# Patient Record
Sex: Female | Born: 1980 | Race: Black or African American | Hispanic: No | Smoking: Never smoker
Health system: Southern US, Community
[De-identification: ages and names within clinical notes are randomized; demographics above are authoritative.]

## PROBLEM LIST (undated history)

## (undated) ENCOUNTER — Inpatient Hospital Stay (HOSPITAL_COMMUNITY): Payer: Self-pay

## (undated) DIAGNOSIS — L309 Dermatitis, unspecified: Secondary | ICD-10-CM

## (undated) DIAGNOSIS — I10 Essential (primary) hypertension: Secondary | ICD-10-CM

## (undated) DIAGNOSIS — Z9109 Other allergy status, other than to drugs and biological substances: Secondary | ICD-10-CM

## (undated) DIAGNOSIS — O1495 Unspecified pre-eclampsia, complicating the puerperium: Secondary | ICD-10-CM

## (undated) DIAGNOSIS — Z789 Other specified health status: Secondary | ICD-10-CM

## (undated) HISTORY — DX: Other allergy status, other than to drugs and biological substances: Z91.09

## (undated) HISTORY — DX: Essential (primary) hypertension: I10

## (undated) HISTORY — DX: Dermatitis, unspecified: L30.9

## (undated) HISTORY — DX: Unspecified pre-eclampsia, complicating the puerperium: O14.95

## (undated) HISTORY — PX: NO PAST SURGERIES: SHX2092

---

## 2001-01-25 ENCOUNTER — Other Ambulatory Visit: Admission: RE | Admit: 2001-01-25 | Discharge: 2001-01-25 | Payer: Self-pay | Admitting: Family Medicine

## 2002-05-23 ENCOUNTER — Other Ambulatory Visit: Admission: RE | Admit: 2002-05-23 | Discharge: 2002-05-23 | Payer: Self-pay | Admitting: Family Medicine

## 2005-01-24 ENCOUNTER — Emergency Department (HOSPITAL_COMMUNITY): Admission: EM | Admit: 2005-01-24 | Discharge: 2005-01-24 | Payer: Self-pay | Admitting: Emergency Medicine

## 2005-06-22 ENCOUNTER — Inpatient Hospital Stay (HOSPITAL_COMMUNITY): Admission: AD | Admit: 2005-06-22 | Discharge: 2005-06-22 | Payer: Self-pay | Admitting: *Deleted

## 2005-06-27 ENCOUNTER — Ambulatory Visit: Payer: Self-pay | Admitting: Obstetrics and Gynecology

## 2005-10-06 ENCOUNTER — Emergency Department (HOSPITAL_COMMUNITY): Admission: EM | Admit: 2005-10-06 | Discharge: 2005-10-06 | Payer: Self-pay | Admitting: Emergency Medicine

## 2006-04-22 ENCOUNTER — Emergency Department (HOSPITAL_COMMUNITY): Admission: EM | Admit: 2006-04-22 | Discharge: 2006-04-22 | Payer: Self-pay | Admitting: Emergency Medicine

## 2007-10-29 ENCOUNTER — Encounter: Payer: Self-pay | Admitting: Family Medicine

## 2007-10-29 ENCOUNTER — Other Ambulatory Visit: Admission: RE | Admit: 2007-10-29 | Discharge: 2007-10-29 | Payer: Self-pay | Admitting: Family Medicine

## 2007-10-29 ENCOUNTER — Ambulatory Visit: Payer: Self-pay | Admitting: Family Medicine

## 2007-11-22 ENCOUNTER — Telehealth: Payer: Self-pay | Admitting: *Deleted

## 2007-12-23 ENCOUNTER — Ambulatory Visit: Payer: Self-pay | Admitting: Family Medicine

## 2008-04-14 DIAGNOSIS — M545 Low back pain: Secondary | ICD-10-CM

## 2008-04-29 ENCOUNTER — Ambulatory Visit: Payer: Self-pay | Admitting: Family Medicine

## 2008-04-29 DIAGNOSIS — L738 Other specified follicular disorders: Secondary | ICD-10-CM | POA: Insufficient documentation

## 2008-05-04 ENCOUNTER — Telehealth: Payer: Self-pay | Admitting: *Deleted

## 2008-07-31 ENCOUNTER — Ambulatory Visit: Payer: Self-pay | Admitting: Family Medicine

## 2008-09-14 ENCOUNTER — Ambulatory Visit: Payer: Self-pay | Admitting: Family Medicine

## 2008-09-14 DIAGNOSIS — N949 Unspecified condition associated with female genital organs and menstrual cycle: Secondary | ICD-10-CM | POA: Insufficient documentation

## 2008-09-16 ENCOUNTER — Telehealth: Payer: Self-pay | Admitting: *Deleted

## 2009-05-02 ENCOUNTER — Inpatient Hospital Stay (HOSPITAL_COMMUNITY): Admission: AD | Admit: 2009-05-02 | Discharge: 2009-05-04 | Payer: Self-pay | Admitting: Obstetrics and Gynecology

## 2010-08-15 LAB — CBC
HCT: 25.3 % — ABNORMAL LOW (ref 36.0–46.0)
MCHC: 33.1 g/dL (ref 30.0–36.0)
MCV: 87.8 fL (ref 78.0–100.0)
Platelets: 138 10*3/uL — ABNORMAL LOW (ref 150–400)
Platelets: 144 10*3/uL — ABNORMAL LOW (ref 150–400)
RBC: 2.88 MIL/uL — ABNORMAL LOW (ref 3.87–5.11)
RBC: 3.23 MIL/uL — ABNORMAL LOW (ref 3.87–5.11)
RDW: 14.3 % (ref 11.5–15.5)
WBC: 10.8 10*3/uL — ABNORMAL HIGH (ref 4.0–10.5)
WBC: 6.8 10*3/uL (ref 4.0–10.5)

## 2010-08-15 LAB — RPR: RPR Ser Ql: NONREACTIVE

## 2010-09-30 NOTE — Group Therapy Note (Signed)
NAME:  Jody Bullock, Jody Bullock NO.:  [id number]   MEDICAL RECORD NO.:  [id number]          PATIENT TYPE:  WOC   LOCATION:  [facility]                   FACILITY:  [facility]   PHYSICIAN:  Argentina Donovan, MD        DATE OF BIRTH:  [birth year]   DATE OF SERVICE:                                    CLINIC NOTE   The patient is a 30 year old nulligravida black female, who went in to the  maternity admissions office 5 days ago with a recurrent left Bartholin gland  abscess.  At that time apparently was not ripe enough to drain and the  patient was put on doxycycline and sitz baths.  During this time since then,  it had drained spontaneously and she comes in today and the area is well  healed and there is no sign of inflammation or pain.  We discussed with her  the problem and we went over the physiology of Bartholin gland cysts,  abscess and the functioning of the gland, and told her if it recurs on that  side again, the best thing to do would be to drain it, but she should  probably hopefully come in before it forms abscess.  The patient seems to  understand quite well what we have discussed and is being discharged.   DIAGNOSIS:  Bartholin gland abscess, resolved.           ______________________________  Argentina Donovan, MD     PR/MEDQ  D:  06/27/2005  T:  06/27/2005  Job:  [id number]

## 2010-11-24 ENCOUNTER — Encounter: Payer: Self-pay | Admitting: Family Medicine

## 2010-11-29 ENCOUNTER — Ambulatory Visit (INDEPENDENT_AMBULATORY_CARE_PROVIDER_SITE_OTHER): Payer: BC Managed Care – PPO | Admitting: Family Medicine

## 2010-11-29 ENCOUNTER — Encounter: Payer: Self-pay | Admitting: Family Medicine

## 2010-11-29 ENCOUNTER — Other Ambulatory Visit (HOSPITAL_COMMUNITY)
Admission: RE | Admit: 2010-11-29 | Discharge: 2010-11-29 | Disposition: A | Discharge status: Home / Self Care | Payer: BC Managed Care – PPO | Source: Ambulatory Visit | Attending: Family Medicine | Admitting: Family Medicine

## 2010-11-29 DIAGNOSIS — R5381 Other malaise: Secondary | ICD-10-CM

## 2010-11-29 DIAGNOSIS — Z01419 Encounter for gynecological examination (general) (routine) without abnormal findings: Secondary | ICD-10-CM

## 2010-11-29 DIAGNOSIS — L738 Other specified follicular disorders: Secondary | ICD-10-CM

## 2010-11-29 DIAGNOSIS — R5383 Other fatigue: Secondary | ICD-10-CM

## 2010-11-29 LAB — CBC WITH DIFFERENTIAL/PLATELET
Basophils Absolute: 0 10*3/uL (ref 0.0–0.1)
HCT: 36.6 % (ref 36.0–46.0)
Lymphs Abs: 2 10*3/uL (ref 0.7–4.0)
Monocytes Relative: 6.9 % (ref 3.0–12.0)
Neutrophils Relative %: 51.7 % (ref 43.0–77.0)
Platelets: 175 10*3/uL (ref 150.0–400.0)
RDW: 14.7 % — ABNORMAL HIGH (ref 11.5–14.6)

## 2010-11-29 LAB — POCT URINALYSIS DIPSTICK
Bilirubin, UA: NEGATIVE
Glucose, UA: NEGATIVE
Spec Grav, UA: 1.03

## 2010-11-29 LAB — HEPATIC FUNCTION PANEL
Alkaline Phosphatase: 51 U/L (ref 39–117)
Bilirubin, Direct: 0 mg/dL (ref 0.0–0.3)

## 2010-11-29 LAB — BASIC METABOLIC PANEL
CO2: 26 mEq/L (ref 19–32)
Calcium: 9 mg/dL (ref 8.4–10.5)
GFR: 115.93 mL/min (ref >60.00)
Sodium: 140 mEq/L (ref 135–145)

## 2010-11-29 NOTE — Patient Instructions (Signed)
Be sure to do a thorough breast exam monthly.  Return in one year, sooner if any problems.  We will cardia.  The report of your lab work.  Motrin 600 mg twice daily with food for knee pain

## 2010-11-29 NOTE — Progress Notes (Signed)
  Subjective:    Patient ID: Jody Bullock, female    DOB: 11-Sep-1980, 30 y.o.   MRN: [id number]  HPI Jody Bullock is a delightful 30 year old single female, nonsmoker G1, P1,,,,,,,,,, a 33-month-old son,,,,,,, who comes in today for a general physical examination because of fatigue.  Postpartum, Dr. Edward Jolly, put in a [place] IUD.  She is still having regular periods despite having an IUD in the she's noticed decreased sex drive.  Otherwise feels well.  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  Tetanus 2009   Review of Systems  Constitutional: Negative.   HENT: Negative.   Eyes: Negative.   Respiratory: Negative.   Cardiovascular: Negative.   Gastrointestinal: Negative.   Genitourinary: Negative.   Musculoskeletal: Negative.   Neurological: Negative.   Hematological: Negative.   Psychiatric/Behavioral: Negative.        Objective:   Physical Exam  Constitutional: She appears well-developed and well-nourished.  HENT:  Head: Normocephalic and atraumatic.  Right Ear: External ear normal.  Left Ear: External ear normal.  Nose: Nose normal.  Mouth/Throat: Oropharynx is clear and moist.  Eyes: EOM are normal. Pupils are equal, round, and reactive to light.  Neck: Normal range of motion. Neck supple. No thyromegaly present.  Cardiovascular: Normal rate, regular rhythm, normal heart sounds and intact distal pulses.  Exam reveals no gallop and no friction rub.   No murmur heard. Pulmonary/Chest: Effort normal and breath sounds normal.  Abdominal: Soft. Bowel sounds are normal. She exhibits no distension and no mass. There is no tenderness. There is no rebound.  Genitourinary: Vagina normal and uterus normal. Guaiac negative stool. No vaginal discharge found.       Bilateral breast exam normal  Musculoskeletal: Normal range of motion.  Lymphadenopathy:    She has no cervical adenopathy.  Neurological: She is alert. She has normal reflexes. No cranial  nerve deficit. She exhibits normal muscle tone. Coordination normal.  Skin: Skin is warm and dry.  Psychiatric: She has a normal mood and affect. Her behavior is normal. Judgment and thought content normal.          Assessment & Plan:  Healthy female.  Fatigue.  Status post childbirth x 1

## 2011-10-04 ENCOUNTER — Ambulatory Visit (INDEPENDENT_AMBULATORY_CARE_PROVIDER_SITE_OTHER): Payer: BC Managed Care – PPO | Admitting: Family Medicine

## 2011-10-04 ENCOUNTER — Encounter: Payer: Self-pay | Admitting: Family Medicine

## 2011-10-04 ENCOUNTER — Telehealth: Payer: Self-pay

## 2011-10-04 ENCOUNTER — Other Ambulatory Visit (HOSPITAL_COMMUNITY)
Admission: RE | Admit: 2011-10-04 | Discharge: 2011-10-04 | Disposition: A | Discharge status: Home / Self Care | Payer: BC Managed Care – PPO | Source: Ambulatory Visit | Attending: Family Medicine | Admitting: Family Medicine

## 2011-10-04 VITALS — BP 110/78 | Temp 98.1°F | Ht 61.0 in | Wt 159.0 lb

## 2011-10-04 DIAGNOSIS — Z0000 Encounter for general adult medical examination without abnormal findings: Secondary | ICD-10-CM | POA: Insufficient documentation

## 2011-10-04 DIAGNOSIS — Z01419 Encounter for gynecological examination (general) (routine) without abnormal findings: Secondary | ICD-10-CM

## 2011-10-04 DIAGNOSIS — R5383 Other fatigue: Secondary | ICD-10-CM

## 2011-10-04 DIAGNOSIS — R5381 Other malaise: Secondary | ICD-10-CM

## 2011-10-04 LAB — CBC WITH DIFFERENTIAL/PLATELET
Basophils Relative: 0.6 % (ref 0.0–3.0)
Eosinophils Relative: 1.4 % (ref 0.0–5.0)
HCT: 34.9 % — ABNORMAL LOW (ref 36.0–46.0)
Lymphs Abs: 2 10*3/uL (ref 0.7–4.0)
MCV: 90.6 fl (ref 78.0–100.0)
Monocytes Absolute: 0.4 10*3/uL (ref 0.1–1.0)
RBC: 3.85 Mil/uL — ABNORMAL LOW (ref 3.87–5.11)
WBC: 4.9 10*3/uL (ref 4.5–10.5)

## 2011-10-04 LAB — BASIC METABOLIC PANEL
CO2: 28 mEq/L (ref 19–32)
Calcium: 9 mg/dL (ref 8.4–10.5)
Creatinine, Ser: 0.7 mg/dL (ref 0.4–1.2)
Glucose, Bld: 49 mg/dL — CL (ref 70–99)
Sodium: 141 mEq/L (ref 135–145)

## 2011-10-04 NOTE — Patient Instructions (Signed)
At home,,,,,,,,,,,,,,, check  the string on the IUD monthly  If there is a question you can not find it,,,,,,,,,,,,, come in and let us recheck u  Will call you to more about her lab work  Remember to do a thorough breast exam monthly

## 2011-10-04 NOTE — Telephone Encounter (Signed)
Spoke with pt- A and O x3 - at work , informed we got a very low blood sugar on her that was drawn this AM - 49 - she states she ate breakfast early before lab had been drawn , but has not eaten the rest of the day - she has candy - I asked her to eat now and get something to drink . I also instructed to eat good dinner , and a snack around 10pm . Call rachel around 845 to check in and see what dr. Tawanna Cooler would like to do. Verbalized understanding .  Note to Dr. Artist Pais to advise further instructions

## 2011-10-04 NOTE — Progress Notes (Signed)
  Subjective:    Patient ID: Jody Bullock, female    DOB: [birth year], 31 y.o.   MRN: [id number]  HPI Jody Bullock is a delightful 31 year old single female G1 P1.......... 30-year-old son.......... she lives with the child's father although they've not gotten married yet,,,,,,,, who comes in today for general physical examination  She's always been in excellent health she's had no chronic health problems. After the delivery of her child she had inserted a Mirena IUD she still has monthly periods despite having the IUD. She says she feels a tired and fatigued wonders if she might not be anemic  Tetanus booster 2009   Review of Systems  Constitutional: Negative.   HENT: Negative.   Eyes: Negative.   Respiratory: Negative.   Cardiovascular: Negative.   Gastrointestinal: Negative.   Genitourinary: Negative.   Musculoskeletal: Negative.   Neurological: Negative.   Hematological: Negative.   Psychiatric/Behavioral: Negative.        Objective:   Physical Exam  Constitutional: She appears well-developed and well-nourished.  HENT:  Head: Normocephalic and atraumatic.  Right Ear: External ear normal.  Left Ear: External ear normal.  Nose: Nose normal.  Mouth/Throat: Oropharynx is clear and moist.  Eyes: EOM are normal. Pupils are equal, round, and reactive to light.  Neck: Normal range of motion. Neck supple. No thyromegaly present.  Cardiovascular: Normal rate, regular rhythm, normal heart sounds and intact distal pulses.  Exam reveals no gallop and no friction rub.   No murmur heard. Pulmonary/Chest: Effort normal and breath sounds normal.  Abdominal: Soft. Bowel sounds are normal. She exhibits no distension and no mass. There is no tenderness. There is no rebound.  Genitourinary: Vagina normal and uterus normal. Guaiac negative stool. No vaginal discharge found.  Musculoskeletal: Normal range of motion.  Lymphadenopathy:    She has no cervical adenopathy.  Neurological: She is  alert. She has normal reflexes. No cranial nerve deficit. She exhibits normal muscle tone. Coordination normal.  Skin: Skin is warm and dry.  Psychiatric: She has a normal mood and affect. Her behavior is normal. Judgment and thought content normal.          Assessment & Plan:  Healthy female  Fatigue check CBC and thyroid level

## 2011-10-04 NOTE — Telephone Encounter (Signed)
Lab shown to Dr. Artist Pais - pt is asymtomatic - call in AM to f/u with .  To Fleet Contras to call

## 2011-11-23 ENCOUNTER — Encounter: Payer: Self-pay | Admitting: Family Medicine

## 2011-11-23 ENCOUNTER — Ambulatory Visit (INDEPENDENT_AMBULATORY_CARE_PROVIDER_SITE_OTHER): Payer: BC Managed Care – PPO | Admitting: Family Medicine

## 2011-11-23 VITALS — BP 110/70 | Temp 98.3°F | Wt 169.0 lb

## 2011-11-23 DIAGNOSIS — N6019 Diffuse cystic mastopathy of unspecified breast: Secondary | ICD-10-CM

## 2011-11-23 DIAGNOSIS — N938 Other specified abnormal uterine and vaginal bleeding: Secondary | ICD-10-CM

## 2011-11-23 DIAGNOSIS — N949 Unspecified condition associated with female genital organs and menstrual cycle: Secondary | ICD-10-CM

## 2011-11-23 MED ORDER — ETHYNODIOL DIAC-ETH ESTRADIOL 1-50 MG-MCG PO TABS: 1.0000 | ORAL_TABLET | Freq: Every day | ORAL | Status: DC

## 2011-11-23 NOTE — Patient Instructions (Addendum)
Do a thorough breast exam monthly after each.  Start the [facility] this coming Sunday  Return for your annual exam in May

## 2011-11-23 NOTE — Progress Notes (Signed)
  Subjective:    Patient ID: Jody Bullock, female    DOB: Jun 12, 1980, 31 y.o.   MRN: [id number]  HPI Lyndee is a 31 year old single female G1 P1,,,,,,,,,, 41-year-old son,,,,,,, who comes in today because she would like her IUD removed  The IUD was put in after her first baby and she would like it removed. She wishes to go back on oral contraceptives  She also complains of soreness in her breasts prior to her period   Review of Systems Gen. review of systems otherwise negative    Objective:   Physical Exam Well-developed and nourished female in acute distress examination of pressure some cystic lesions at 6:00 right and left breasts are soft small movable and tender  Pelvic examination the string was visualized the IUD was removed.       Assessment & Plan:  Fibrocystic breast changes plan BSE monthly Motrin when necessary  Restart BCPs

## 2011-11-29 ENCOUNTER — Telehealth: Payer: Self-pay | Admitting: Family Medicine

## 2011-11-29 ENCOUNTER — Ambulatory Visit: Payer: BC Managed Care – PPO | Admitting: Family Medicine

## 2011-11-29 NOTE — Telephone Encounter (Signed)
Patient called stating that she was given new birth control pills and they have her really sick and she would like to know if there is another one she can take. Please advise.

## 2011-11-30 ENCOUNTER — Ambulatory Visit (INDEPENDENT_AMBULATORY_CARE_PROVIDER_SITE_OTHER): Payer: BC Managed Care – PPO | Admitting: Family Medicine

## 2011-11-30 ENCOUNTER — Encounter: Payer: Self-pay | Admitting: Family Medicine

## 2011-11-30 VITALS — BP 110/70 | Temp 99.0°F | Wt 160.0 lb

## 2011-11-30 DIAGNOSIS — N925 Other specified irregular menstruation: Secondary | ICD-10-CM

## 2011-11-30 DIAGNOSIS — N949 Unspecified condition associated with female genital organs and menstrual cycle: Secondary | ICD-10-CM

## 2011-11-30 MED ORDER — ETHYNODIOL DIAC-ETH ESTRADIOL 1-35 MG-MCG PO TABS: 1.0000 | ORAL_TABLET | Freq: Every day | ORAL | Status: DC

## 2011-11-30 NOTE — Progress Notes (Signed)
  Subjective:    Patient ID: Jody Bullock, female    DOB: 1980-10-08, 31 y.o.   MRN: [id number]  HPI Jody Bullock is a 31 year old female who comes in today for evaluation of birth control  We removed her IUD a week ago and started her on Ortho-Novum 1/50 however she states it made her nauseated. Review of systems otherwise negative   Review of Systems    review of systems negative Objective:   Physical Exam  Well-developed and nourished female no acute distress cardiopulmonary exam normal      Assessment & Plan:  Side effects from BCPs plan decrease dose to 135

## 2011-11-30 NOTE — Patient Instructions (Addendum)
Let's decrease the dose and take the pill with some food prior to bedtime

## 2011-12-02 ENCOUNTER — Encounter (HOSPITAL_COMMUNITY): Payer: Self-pay | Admitting: *Deleted

## 2011-12-02 ENCOUNTER — Emergency Department (HOSPITAL_COMMUNITY)
Admission: EM | Admit: 2011-12-02 | Discharge: 2011-12-02 | Disposition: A | Discharge status: Home / Self Care | Payer: BC Managed Care – PPO | Attending: Emergency Medicine | Admitting: Emergency Medicine

## 2011-12-02 ENCOUNTER — Emergency Department (HOSPITAL_COMMUNITY): Payer: BC Managed Care – PPO

## 2011-12-02 DIAGNOSIS — K219 Gastro-esophageal reflux disease without esophagitis: Secondary | ICD-10-CM | POA: Insufficient documentation

## 2011-12-02 LAB — POCT I-STAT, CHEM 8
BUN: 8 mg/dL (ref 6–23)
Calcium, Ion: 1.24 mmol/L — ABNORMAL HIGH (ref 1.12–1.23)
Creatinine, Ser: 0.9 mg/dL (ref 0.50–1.10)
Glucose, Bld: 86 mg/dL (ref 70–99)
TCO2: 24 mmol/L (ref 0–100)

## 2011-12-02 MED ORDER — OXYCODONE-ACETAMINOPHEN 5-325 MG PO TABS
2.0000 | ORAL_TABLET | Freq: Once | ORAL | Status: AC
  Administered 2011-12-02: 2 via ORAL
  Filled 2011-12-02: qty 2

## 2011-12-02 MED ORDER — FAMOTIDINE 20 MG PO TABS: 20.0000 mg | ORAL_TABLET | Freq: Two times a day (BID) | ORAL | Status: DC

## 2011-12-02 MED ORDER — HYDROCODONE-ACETAMINOPHEN 5-500 MG PO TABS: 1.0000 | ORAL_TABLET | Freq: Four times a day (QID) | ORAL | Status: AC | PRN

## 2011-12-02 NOTE — ED Provider Notes (Signed)
History     CSN: [id number]  Arrival date & time 12/02/11  0944   First MD Initiated Contact with Patient 12/02/11 1138      Chief Complaint  Patient presents with  . Chest Pain    (Consider location/radiation/quality/duration/timing/severity/associated sxs/prior treatment) HPI   Jody Bullock presents to the ER brought in by her significant other with complaints of chest discomfort since last Sunday. He states that it started in her right shoulder and now is in her throat and substernal region. It gets worse after eating. She states that she has been very gassy and burping he as well. She admits that her dad has a PMH of GERD. Denies pain worsening with palpation 4 with large inspiration. Denies use of tobacco, smoking, family history of cardiac disease. She started birth control one week ago. She saw her primary care doctor for this problem and he told her nothing was wrong. She states that her pain is mild and intermittent. And does not worsen or resolve with activity. Not had any symptoms today. If, nausea, vomiting, diarrhea.   History reviewed. No pertinent past medical history.  History reviewed. No pertinent past surgical history.  Family History  Problem Relation Age of Onset  . Gallbladder disease Mother   . Cancer Father     prostate     History  Substance Use Topics  . Smoking status: Never Smoker   . Smokeless tobacco: Not on file  . Alcohol Use: No    OB History    Grav Para Term Preterm Abortions TAB SAB Ect Mult Living                  Review of Systems   HEENT: denies blurry vision or change in hearing PULMONARY: Denies difficulty breathing and SOB CARDIAC: denies chest pain or heart palpitations at this time. MUSCULOSKELETAL:  denies being unable to ambulate ABDOMEN AL: denies abdominal pain GU: denies loss of bowel or urinary control NEURO: denies numbness and tingling in extremities SKIN: no new rashes PSYCH: patient denies anxiety or depression. NECK:  Pt denies having neck pain     Allergies  Review of patient's allergies indicates no known allergies.  Home Medications   Current Outpatient Rx  Name Route Sig Dispense Refill  . EXCEDRIN PO Oral Take 2 tablets by mouth once.    . ETHYNODIOL DIAC-ETH ESTRADIOL 1-50 MG-MCG PO TABS Oral Take 1 tablet by mouth daily.    Marland Kitchen FAMOTIDINE 20 MG PO TABS Oral Take 1 tablet (20 mg total) by mouth 2 (two) times daily. 30 tablet 0  . HYDROCODONE-ACETAMINOPHEN 5-500 MG PO TABS Oral Take 1 tablet by mouth every 6 (six) hours as needed for pain. 15 tablet 0    BP 155/90  Pulse 78  Temp 98.3 F (36.8 C) (Oral)  Resp 20  SpO2 100%  LMP 11/20/2011  Physical Exam  Nursing note and vitals reviewed. Constitutional: She appears well-developed and well-nourished. No distress.  HENT:  Head: Normocephalic and atraumatic.  Eyes: Pupils are equal, round, and reactive to light.  Neck: Normal range of motion. Neck supple.  Cardiovascular: Normal rate, regular rhythm and normal heart sounds.   No murmur heard. Pulmonary/Chest: Effort normal. No respiratory distress. She has no wheezes. She has no rales. She exhibits no tenderness.  Abdominal: Soft. There is no tenderness. There is no rebound and no guarding.  Neurological: She is alert.  Skin: Skin is warm and dry.    ED Course  Procedures (including critical  care time)  Labs Reviewed  POCT I-STAT, CHEM 8 - Abnormal; Notable for the following:    Calcium, Ion 1.24 (*)     All other components within normal limits  D-DIMER, QUANTITATIVE   Dg Chest 2 View  12/02/2011  *RADIOLOGY REPORT*  Clinical Data: Chest and back pain  CHEST - 2 VIEW  Comparison:  None.  Findings:  The heart size and mediastinal contours are within normal limits.  Both lungs are clear.  The visualized skeletal structures are unremarkable.  IMPRESSION: No active cardiopulmonary disease.  Original Report Authenticated By: Judie Petit. Ruel Favors, M.D.     1. GERD (gastroesophageal  reflux disease)       MDM   Date: 12/02/2011  Rate: 56  Rhythm: sinus bradycardia  QRS Axis: normal  Intervals: normal  ST/T Wave abnormalities: normal  Conduction Disutrbances:none  Narrative Interpretation:   Old EKG Reviewed: none available   Him sounds like reflux disease. Chest x-ray, EKG, d-dimer all within normal limits. Asked with patient the findings and plan. I've advised her to followup with Dr. Tawanna Cooler on Monday and Tuesday to see how anti-acid medication is helping with her symptoms. During exam the patient burps many times and complains that her throat hurts after the burp and then chest burns.  Will do trial of pepcid for two weeks  Pt has been advised of the symptoms that warrant their return to the ED. Patient has voiced understanding and has agreed to follow-up with the PCP or specialist.       Dorthula Matas, PA 12/02/11 1349  Dorthula Matas, PA 12/02/11 1350

## 2011-12-02 NOTE — ED Notes (Signed)
Reports starting new BC last Sunday, started with headache, pain moved to right arm and now into her chest. No acute distress noted at triage, ekg done.

## 2011-12-04 NOTE — ED Provider Notes (Signed)
Medical screening examination/treatment/procedure(s) were conducted as a shared visit with non-physician practitioner(s) and myself.  I personally evaluated the patient during the encounter Pt c/o atypical cp, midline. Chest cta. Rrr. Labs.   Suzi Roots, MD 12/04/11 [phone]

## 2012-07-29 ENCOUNTER — Telehealth: Payer: Self-pay | Admitting: Family Medicine

## 2012-07-29 ENCOUNTER — Ambulatory Visit: Payer: BC Managed Care – PPO | Admitting: Family Medicine

## 2012-07-29 NOTE — Telephone Encounter (Signed)
Pt would like to go back to Mirena birth control method. Pt cannot take the [facility] pill. Pt doesn't want to wait too long to put Mirena back in, as she needs BC control!! Pt would like to know if it is ok to schedule this with another provider here, if so who? Pls advise.

## 2012-07-29 NOTE — Telephone Encounter (Signed)
We do not place Mirena in the office.  She will have to go to GYN.  If she would like another form of BCP then okay to see Ms Orvan Falconer or Dr Selena Batten

## 2012-07-30 NOTE — Telephone Encounter (Signed)
lmom for pt to return my call/kh °

## 2012-07-30 NOTE — Telephone Encounter (Signed)
Pt will call a GYN/kh

## 2012-09-03 DIAGNOSIS — O039 Complete or unspecified spontaneous abortion without complication: Secondary | ICD-10-CM | POA: Insufficient documentation

## 2012-09-03 HISTORY — DX: Complete or unspecified spontaneous abortion without complication: O03.9

## 2012-09-05 ENCOUNTER — Ambulatory Visit (INDEPENDENT_AMBULATORY_CARE_PROVIDER_SITE_OTHER): Payer: BC Managed Care – PPO | Admitting: Family Medicine

## 2012-09-05 ENCOUNTER — Inpatient Hospital Stay (HOSPITAL_COMMUNITY): Payer: BC Managed Care – PPO

## 2012-09-05 ENCOUNTER — Encounter (HOSPITAL_COMMUNITY): Payer: Self-pay | Admitting: *Deleted

## 2012-09-05 ENCOUNTER — Encounter: Payer: Self-pay | Admitting: Family Medicine

## 2012-09-05 ENCOUNTER — Inpatient Hospital Stay (HOSPITAL_COMMUNITY)
Admission: AD | Admit: 2012-09-05 | Discharge: 2012-09-05 | Disposition: A | Discharge status: Home / Self Care | Payer: BC Managed Care – PPO | Source: Ambulatory Visit | Attending: Obstetrics & Gynecology | Admitting: Obstetrics & Gynecology

## 2012-09-05 VITALS — BP 120/80 | Temp 98.3°F | Wt 170.0 lb

## 2012-09-05 DIAGNOSIS — O0990 Supervision of high risk pregnancy, unspecified, unspecified trimester: Secondary | ICD-10-CM

## 2012-09-05 DIAGNOSIS — N949 Unspecified condition associated with female genital organs and menstrual cycle: Secondary | ICD-10-CM

## 2012-09-05 DIAGNOSIS — O2390 Unspecified genitourinary tract infection in pregnancy, unspecified trimester: Secondary | ICD-10-CM | POA: Insufficient documentation

## 2012-09-05 DIAGNOSIS — O200 Threatened abortion: Secondary | ICD-10-CM | POA: Insufficient documentation

## 2012-09-05 DIAGNOSIS — B9689 Other specified bacterial agents as the cause of diseases classified elsewhere: Secondary | ICD-10-CM | POA: Insufficient documentation

## 2012-09-05 DIAGNOSIS — A499 Bacterial infection, unspecified: Secondary | ICD-10-CM

## 2012-09-05 DIAGNOSIS — N938 Other specified abnormal uterine and vaginal bleeding: Secondary | ICD-10-CM

## 2012-09-05 DIAGNOSIS — N760 Acute vaginitis: Secondary | ICD-10-CM

## 2012-09-05 HISTORY — DX: Supervision of high risk pregnancy, unspecified, unspecified trimester: O09.90

## 2012-09-05 HISTORY — DX: Other specified health status: Z78.9

## 2012-09-05 LAB — CBC
MCV: 88.4 fL (ref 78.0–100.0)
Platelets: 155 10*3/uL (ref 150–400)
RDW: 13.8 % (ref 11.5–15.5)
WBC: 5.2 10*3/uL (ref 4.0–10.5)

## 2012-09-05 LAB — ABO/RH: ABO/RH(D): O POS

## 2012-09-05 LAB — WET PREP, GENITAL: Yeast Wet Prep HPF POC: NONE SEEN

## 2012-09-05 MED ORDER — METRONIDAZOLE 500 MG PO TABS: 500.0000 mg | ORAL_TABLET | Freq: Two times a day (BID) | ORAL | Status: DC

## 2012-09-05 NOTE — Progress Notes (Signed)
  Subjective:    Patient ID: Jody Bullock, female    DOB: [birth year], 32 y.o.   MRN: [id number]  HPI Jody Bullock is a 32 year old single female who comes in today having taken 2 pregnancy tests at home which are positive  Her last normal period was March 10. This past Monday she began having cramping and bleeding. She has an appointment to see the folks at [facility] may the second   Review of Systems    review of systems otherwise negative Objective:   Physical Exam Examination the abdomen the abdomen is soft the bowel sounds are normal no tenderness       Assessment & Plan:  From her dates I suspect she's [redacted] weeks pregnant with an impending miscarriage plan refer to GYN

## 2012-09-05 NOTE — MAU Note (Signed)
Told was preg, started bleeding on Monday., passed a clot on Monday, continues to bleed. Feels like contractions.

## 2012-09-05 NOTE — Patient Instructions (Addendum)
Go directly to the [facility] emergency room it appears you're having a spontaneous miscarriage

## 2012-09-05 NOTE — MAU Provider Note (Signed)
History     CSN: [id number]  Arrival date and time: 09/05/12 1121   None     Chief Complaint  Patient presents with  . Threatened Miscarriage   HPI  Pt is here with report of vaginal bleeding and lower pelvic cramping that started on Monday.  Bleeding is described as less than a period with the passing of a clot the next day.  Bleeding increased since passing the clot.  +pregnancy test last week.  Patient's last menstrual period was 07/22/2012.   Past Medical History  Diagnosis Date  . Medical history non-contributory     Past Surgical History  Procedure Laterality Date  . No past surgeries      Family History  Problem Relation Age of Onset  . Gallbladder disease Mother   . Cancer Father     prostate     History  Substance Use Topics  . Smoking status: Never Smoker   . Smokeless tobacco: Not on file  . Alcohol Use: No    Allergies: No Known Allergies  Prescriptions prior to admission  Medication Sig Dispense Refill  . Aspirin-Acetaminophen-Caffeine (EXCEDRIN PO) Take 2 tablets by mouth daily as needed (headaches).         Review of Systems  Gastrointestinal: Positive for abdominal pain (cramping).  Genitourinary:       Vaginal bleeding  All other systems reviewed and are negative.   Physical Exam   Blood pressure 137/83, pulse 56, temperature 98.8 F (37.1 C), temperature source Oral, resp. rate 18, height 5\' 1"  (1.549 m), weight 76.658 kg (169 lb), last menstrual period 07/22/2012.  Physical Exam  Constitutional: She is oriented to person, place, and time. She appears well-developed and well-nourished.  HENT:  Head: Normocephalic.  Neck: Normal range of motion. Neck supple.  Cardiovascular: Normal rate, regular rhythm and normal heart sounds.   Respiratory: Effort normal and breath sounds normal. No respiratory distress.  GI: Soft. She exhibits no mass. There is no tenderness. There is no rebound and no guarding.  Genitourinary: There is bleeding  around the vagina.  Neurological: She is alert and oriented to person, place, and time.  Skin: Skin is warm and dry.    MAU Course  Procedures Results for orders placed during the hospital encounter of 09/05/12 (from the past 24 hour(s))  POCT PREGNANCY, URINE     Status: Abnormal   Collection Time    09/05/12 12:20 PM      Result Value Range   Preg Test, Ur POSITIVE (*) NEGATIVE  ABO/RH     Status: None   Collection Time    09/05/12 12:34 PM      Result Value Range   ABO/RH(D) O POS    HCG, QUANTITATIVE, PREGNANCY     Status: Abnormal   Collection Time    09/05/12 12:34 PM      Result Value Range   hCG, Beta Chain, Quant, S 223 (*) <5 mIU/mL  CBC     Status: Abnormal   Collection Time    09/05/12 12:35 PM      Result Value Range   WBC 5.2  4.0 - 10.5 K/uL   RBC 3.97  3.87 - 5.11 MIL/uL   Hemoglobin 11.9 (*) 12.0 - 15.0 g/dL   HCT 29.5 (*) 62.1 - 30.8 %   MCV 88.4  78.0 - 100.0 fL   MCH 30.0  26.0 - 34.0 pg   MCHC 33.9  30.0 - 36.0 g/dL   RDW 65.7  84.6 -  15.5 %   Platelets 155  150 - 400 K/uL  WET PREP, GENITAL     Status: Abnormal   Collection Time    09/05/12 12:43 PM      Result Value Range   Yeast Wet Prep HPF POC NONE SEEN  NONE SEEN   Trich, Wet Prep NONE SEEN  NONE SEEN   Clue Cells Wet Prep HPF POC FEW (*) NONE SEEN   WBC, Wet Prep HPF POC FEW (*) NONE SEEN     Assessment and Plan   Bleeding in Pregnancy Bacterial Vaginosis  Plan: DC to home RX Flagyl Return in 48 hours for repeat BHCG   [facility] 09/05/2012, 12:20 PM

## 2012-09-06 LAB — GC/CHLAMYDIA PROBE AMP: CT Probe RNA: NEGATIVE

## 2012-09-07 ENCOUNTER — Inpatient Hospital Stay (HOSPITAL_COMMUNITY)
Admission: AD | Admit: 2012-09-07 | Discharge: 2012-09-07 | Disposition: A | Discharge status: Home / Self Care | Payer: BC Managed Care – PPO | Source: Ambulatory Visit | Attending: Obstetrics & Gynecology | Admitting: Obstetrics & Gynecology

## 2012-09-07 DIAGNOSIS — O039 Complete or unspecified spontaneous abortion without complication: Secondary | ICD-10-CM | POA: Insufficient documentation

## 2012-09-07 LAB — HCG, QUANTITATIVE, PREGNANCY: hCG, Beta Chain, Quant, S: 97 m[IU]/mL — ABNORMAL HIGH (ref <5)

## 2012-09-07 NOTE — MAU Provider Note (Signed)
  History     CSN: [id number]  Arrival date and time: 09/07/12 1123   First Provider Initiated Contact with Patient 09/07/12 1238      Chief Complaint  Patient presents with  . Follow-up   HPI Jody Bullock 32 y.o. Comes for repeat quant after being seen on 09-05-12.  Was having heavy vaginal bleeding that day and nothing was seen on ultrasound in the uterus.  Client reports she is feeling good and is no longer having pain.    OB History   Grav Para Term Preterm Abortions TAB SAB Ect Mult Living   2 1 1       1       Past Medical History  Diagnosis Date  . Medical history non-contributory     Past Surgical History  Procedure Laterality Date  . No past surgeries      Family History  Problem Relation Age of Onset  . Gallbladder disease Mother   . Cancer Father     prostate     History  Substance Use Topics  . Smoking status: Never Smoker   . Smokeless tobacco: Not on file  . Alcohol Use: No    Allergies: No Known Allergies  Prescriptions prior to admission  Medication Sig Dispense Refill  . metroNIDAZOLE (FLAGYL) 500 MG tablet Take 1 tablet (500 mg total) by mouth 2 (two) times daily.  14 tablet  0    Review of Systems  Constitutional: Negative for fever.  Gastrointestinal: Negative for nausea, vomiting, abdominal pain, diarrhea and constipation.  Genitourinary:       Having some vaginal spotting   Physical Exam   Blood pressure 124/71, pulse 71, resp. rate 18, last menstrual period 07/22/2012.  Physical Exam  Nursing note and vitals reviewed. Constitutional: She is oriented to person, place, and time. She appears well-developed and well-nourished.  HENT:  Head: Normocephalic.  Eyes: EOM are normal.  Neck: Neck supple.  Musculoskeletal: Normal range of motion.  Neurological: She is alert and oriented to person, place, and time.  Skin: Skin is warm and dry.  Psychiatric: She has a normal mood and affect.    MAU Course   Procedures  MDM Results for Jody Bullock, Jody Bullock (MRN [id number]) as of 09/07/2012 12:37  Ref. Range 09/05/2012 12:34 09/07/2012 11:31  hCG, Beta Chain, Quant, S Latest Range: <5 mIU/mL 223 (H) 97 (H)    Assessment and Plan  Miscarriage  Plan Keep your appointment for follow up at [facility] on May 7. No sex, nothing in the vagina. Return if you develop fever or worsening pain.  Jakhari Space 09/07/2012, 12:38 PM

## 2012-09-07 NOTE — MAU Note (Signed)
Pt here for f/u BHCG reports still having a little bit of spotting no pain.

## 2012-09-30 ENCOUNTER — Other Ambulatory Visit: Payer: BC Managed Care – PPO

## 2012-10-08 ENCOUNTER — Encounter: Payer: BC Managed Care – PPO | Admitting: Family Medicine

## 2012-11-19 ENCOUNTER — Other Ambulatory Visit: Payer: Self-pay

## 2012-11-19 ENCOUNTER — Other Ambulatory Visit (INDEPENDENT_AMBULATORY_CARE_PROVIDER_SITE_OTHER): Payer: BC Managed Care – PPO

## 2012-11-19 DIAGNOSIS — Z0000 Encounter for general adult medical examination without abnormal findings: Secondary | ICD-10-CM

## 2012-11-19 LAB — LIPID PANEL
Cholesterol: 159 mg/dL (ref 0–200)
HDL: 65 mg/dL (ref >39.00)
LDL Cholesterol: 83 mg/dL (ref 0–99)
Total CHOL/HDL Ratio: 2
Triglycerides: 53 mg/dL (ref 0.0–149.0)

## 2012-11-19 LAB — HEPATIC FUNCTION PANEL
AST: 21 U/L (ref 0–37)
Alkaline Phosphatase: 46 U/L (ref 39–117)
Bilirubin, Direct: 0.1 mg/dL (ref 0.0–0.3)
Total Bilirubin: 0.6 mg/dL (ref 0.3–1.2)

## 2012-11-19 LAB — BASIC METABOLIC PANEL
BUN: 10 mg/dL (ref 6–23)
Calcium: 9.4 mg/dL (ref 8.4–10.5)
GFR: 106.27 mL/min (ref >60.00)
Glucose, Bld: 81 mg/dL (ref 70–99)
Sodium: 140 mEq/L (ref 135–145)

## 2012-11-19 LAB — POCT URINALYSIS DIPSTICK
Bilirubin, UA: NEGATIVE
Glucose, UA: NEGATIVE
Spec Grav, UA: >=1.03

## 2012-11-19 LAB — CBC WITH DIFFERENTIAL/PLATELET
Basophils Absolute: 0 10*3/uL (ref 0.0–0.1)
Hemoglobin: 12.9 g/dL (ref 12.0–15.0)
Lymphocytes Relative: 35.6 % (ref 12.0–46.0)
Monocytes Relative: 7.1 % (ref 3.0–12.0)
Neutrophils Relative %: 55.9 % (ref 43.0–77.0)
Platelets: 168 10*3/uL (ref 150.0–400.0)
RDW: 14.1 % (ref 11.5–14.6)

## 2012-11-26 ENCOUNTER — Encounter: Payer: Self-pay | Admitting: Family Medicine

## 2012-11-26 ENCOUNTER — Ambulatory Visit (INDEPENDENT_AMBULATORY_CARE_PROVIDER_SITE_OTHER): Payer: BC Managed Care – PPO | Admitting: Family Medicine

## 2012-11-26 VITALS — BP 110/78 | Temp 98.4°F | Ht 62.0 in | Wt 169.0 lb

## 2012-11-26 DIAGNOSIS — Z0000 Encounter for general adult medical examination without abnormal findings: Secondary | ICD-10-CM

## 2012-11-26 NOTE — Progress Notes (Signed)
  Subjective:    Patient ID: Jody Bullock, female    DOB: 24-Jul-1980, 32 y.o.   MRN: [id number]  HPI  Jody Bullock is a 32 year old female G1 P45,,,,,,,, 64-year-old son,,,,,,,, who comes in today for general physical examination  2 months ago she had a Mirena IUD inserted by Dr. Henderson Cloud. She's still having periods  She's otherwise been in excellent health he takes no other medication on a regular basis.  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   HENT: Negative.   Eyes: Negative.   Respiratory: Negative.   Cardiovascular: Negative.   Gastrointestinal: Negative.   Genitourinary: Negative.   Musculoskeletal: Negative.   Neurological: Negative.   Psychiatric/Behavioral: Negative.        Objective:   Physical Exam  Constitutional: She appears well-developed and well-nourished.  HENT:  Head: Normocephalic and atraumatic.  Right Ear: External ear normal.  Left Ear: External ear normal.  Nose: Nose normal.  Mouth/Throat: Oropharynx is clear and moist.  Eyes: EOM are normal. Pupils are equal, round, and reactive to light.  Neck: Normal range of motion. Neck supple. No thyromegaly present.  Cardiovascular: Normal rate, regular rhythm, normal heart sounds and intact distal pulses.  Exam reveals no gallop and no friction rub.   No murmur heard. Pulmonary/Chest: Effort normal and breath sounds normal.  Abdominal: Soft. Bowel sounds are normal. She exhibits no distension and no mass. There is no tenderness. There is no rebound.  Genitourinary: Vagina normal and uterus normal. No vaginal discharge found.  Bilateral breast exam normal Pap deferred because she's having a.  Musculoskeletal: Normal range of motion.  Lymphadenopathy:    She has no cervical adenopathy.  Neurological: She is alert. She has normal reflexes. No cranial nerve deficit. She exhibits normal muscle tone.  Coordination normal.  Skin: Skin is warm and dry.  Psychiatric: She has a normal mood and affect. Her behavior is normal. Judgment and thought content normal.   Breast exam shows multiple small fibrocystic changes has been previously present       Assessment & Plan:  Healthy female  Dysfunction uterine bleeding with Mirena IUD this should resolve spontaneously  History of fibrocystic breast changes advise BSE monthly

## 2012-11-26 NOTE — Patient Instructions (Signed)
Take one iron tablet daily while you're still having periods  Return in one year for general physical examination  Return in a month or 2 for followup Pap smear when you're not having her period,,,,,,,,,,, call and leave a voicemail for Fleet Contras and she'll set up for you

## 2013-02-25 ENCOUNTER — Telehealth: Payer: Self-pay | Admitting: Family Medicine

## 2013-02-25 NOTE — Telephone Encounter (Signed)
There is no co pay.  It is part of her physical

## 2013-02-25 NOTE — Telephone Encounter (Signed)
Pt states she was on her cycle when she had her cpe, and was told to call back to get in for a pap. pls advise.

## 2013-02-25 NOTE — Telephone Encounter (Signed)
Please call patient and schedule a 15 min appointment for pap only thanks

## 2013-02-25 NOTE — Telephone Encounter (Signed)
Put in appt notes/kh

## 2013-02-25 NOTE — Telephone Encounter (Signed)
Pt would like to know if there will be a copay or is this part of CPE that did not get finished. pls advise

## 2013-03-03 ENCOUNTER — Ambulatory Visit (INDEPENDENT_AMBULATORY_CARE_PROVIDER_SITE_OTHER): Payer: BC Managed Care – PPO | Admitting: Family Medicine

## 2013-03-03 ENCOUNTER — Encounter: Payer: Self-pay | Admitting: Family Medicine

## 2013-03-03 ENCOUNTER — Other Ambulatory Visit (HOSPITAL_COMMUNITY)
Admission: RE | Admit: 2013-03-03 | Discharge: 2013-03-03 | Disposition: A | Discharge status: Home / Self Care | Payer: BC Managed Care – PPO | Source: Ambulatory Visit | Attending: Family Medicine | Admitting: Family Medicine

## 2013-03-03 DIAGNOSIS — Z23 Encounter for immunization: Secondary | ICD-10-CM

## 2013-03-03 DIAGNOSIS — N949 Unspecified condition associated with female genital organs and menstrual cycle: Secondary | ICD-10-CM

## 2013-03-03 DIAGNOSIS — Z01419 Encounter for gynecological examination (general) (routine) without abnormal findings: Secondary | ICD-10-CM | POA: Insufficient documentation

## 2013-03-03 DIAGNOSIS — N938 Other specified abnormal uterine and vaginal bleeding: Secondary | ICD-10-CM

## 2013-03-03 NOTE — Addendum Note (Signed)
Addended by: Kern Reap B on: 03/03/2013 11:45 AM   Modules accepted: Orders

## 2013-03-03 NOTE — Patient Instructions (Signed)
Check the string monthly  Return in one year sooner if any problems

## 2013-03-03 NOTE — Progress Notes (Signed)
  Subjective:    Patient ID: Jody Bullock, female    DOB: June 11, 1980, 32 y.o.   MRN: [id number]  HPI Jody Bullock is a 32 year old female who comes in today for pelvic examination  She [facility] about 4 months ago. She's due for Pap smear on the Mirena she has a 40. Monthly   Review of Systems    review of systems negative Objective:   Physical Exam  Pelvic exam shows external genitalia within normal limits is 2 hair follicles of 4 marble-sized cystic type lesion she's otherwise asymptomatic once the left labia once in the anterior pubic area. Bimanual exam negative cervix was visualized Pap smear was done string was in the cervical os      Assessment & Plan:  Normal pelvic exam check Pap

## 2013-04-30 ENCOUNTER — Encounter: Payer: Self-pay | Admitting: Family Medicine

## 2013-04-30 ENCOUNTER — Ambulatory Visit (INDEPENDENT_AMBULATORY_CARE_PROVIDER_SITE_OTHER): Payer: BC Managed Care – PPO | Admitting: Family Medicine

## 2013-04-30 VITALS — BP 110/70 | Temp 98.8°F | Wt 167.0 lb

## 2013-04-30 DIAGNOSIS — J309 Allergic rhinitis, unspecified: Secondary | ICD-10-CM | POA: Insufficient documentation

## 2013-04-30 DIAGNOSIS — L678 Other hair color and hair shaft abnormalities: Secondary | ICD-10-CM

## 2013-04-30 DIAGNOSIS — L738 Other specified follicular disorders: Secondary | ICD-10-CM

## 2013-04-30 MED ORDER — FLUTICASONE PROPIONATE 50 MCG/ACT NA SUSP: NASAL | Status: DC

## 2013-04-30 MED ORDER — CEPHALEXIN 500 MG PO CAPS: ORAL_CAPSULE | ORAL | Status: DC

## 2013-04-30 NOTE — Progress Notes (Signed)
   Subjective:    Patient ID: Monia Bullock, female    DOB: [birth year], 32 y.o.   MRN: [id number]  HPI [name] is a 32 year old female who comes in today for evaluation of 2 problems  She has allergic rhinitis for which she's been taking over-the-counter Zyrtec but it hasn't been helping she has had congestion runny nose no cough no asthma  She's had a history of folliculitis in his noticed some bumps on her back and her legs.   Review of Systems    review of systems negative Objective:   Physical Exam Well-developed well nourished female no acute distress vital signs stable she is afebrile HEENT HEENT negative neck was supple  Skin exam shows some small follicular lesions on her anterior thigh back some of which are resolving spontaneously       Assessment & Plan:  Allergic rhinitis as steroid nasal spray  Folliculitis Keflex

## 2013-04-30 NOTE — Patient Instructions (Signed)
Continue the Zyrtec 10 mg plain,,,,,,,, 1 at bedtime  Steroid nasal spray,,,,,, one shot each nostril at bedtime  Keflex 500 mg....... 2 tabs twice daily for the folliculitis

## 2013-05-27 IMAGING — CR DG CHEST 2V
2 series · 2 of 2 positions shown · non-contrast
Comparison: None.

CLINICAL DATA: Chest and back pain

CHEST - 2 VIEW

[w chest pa]
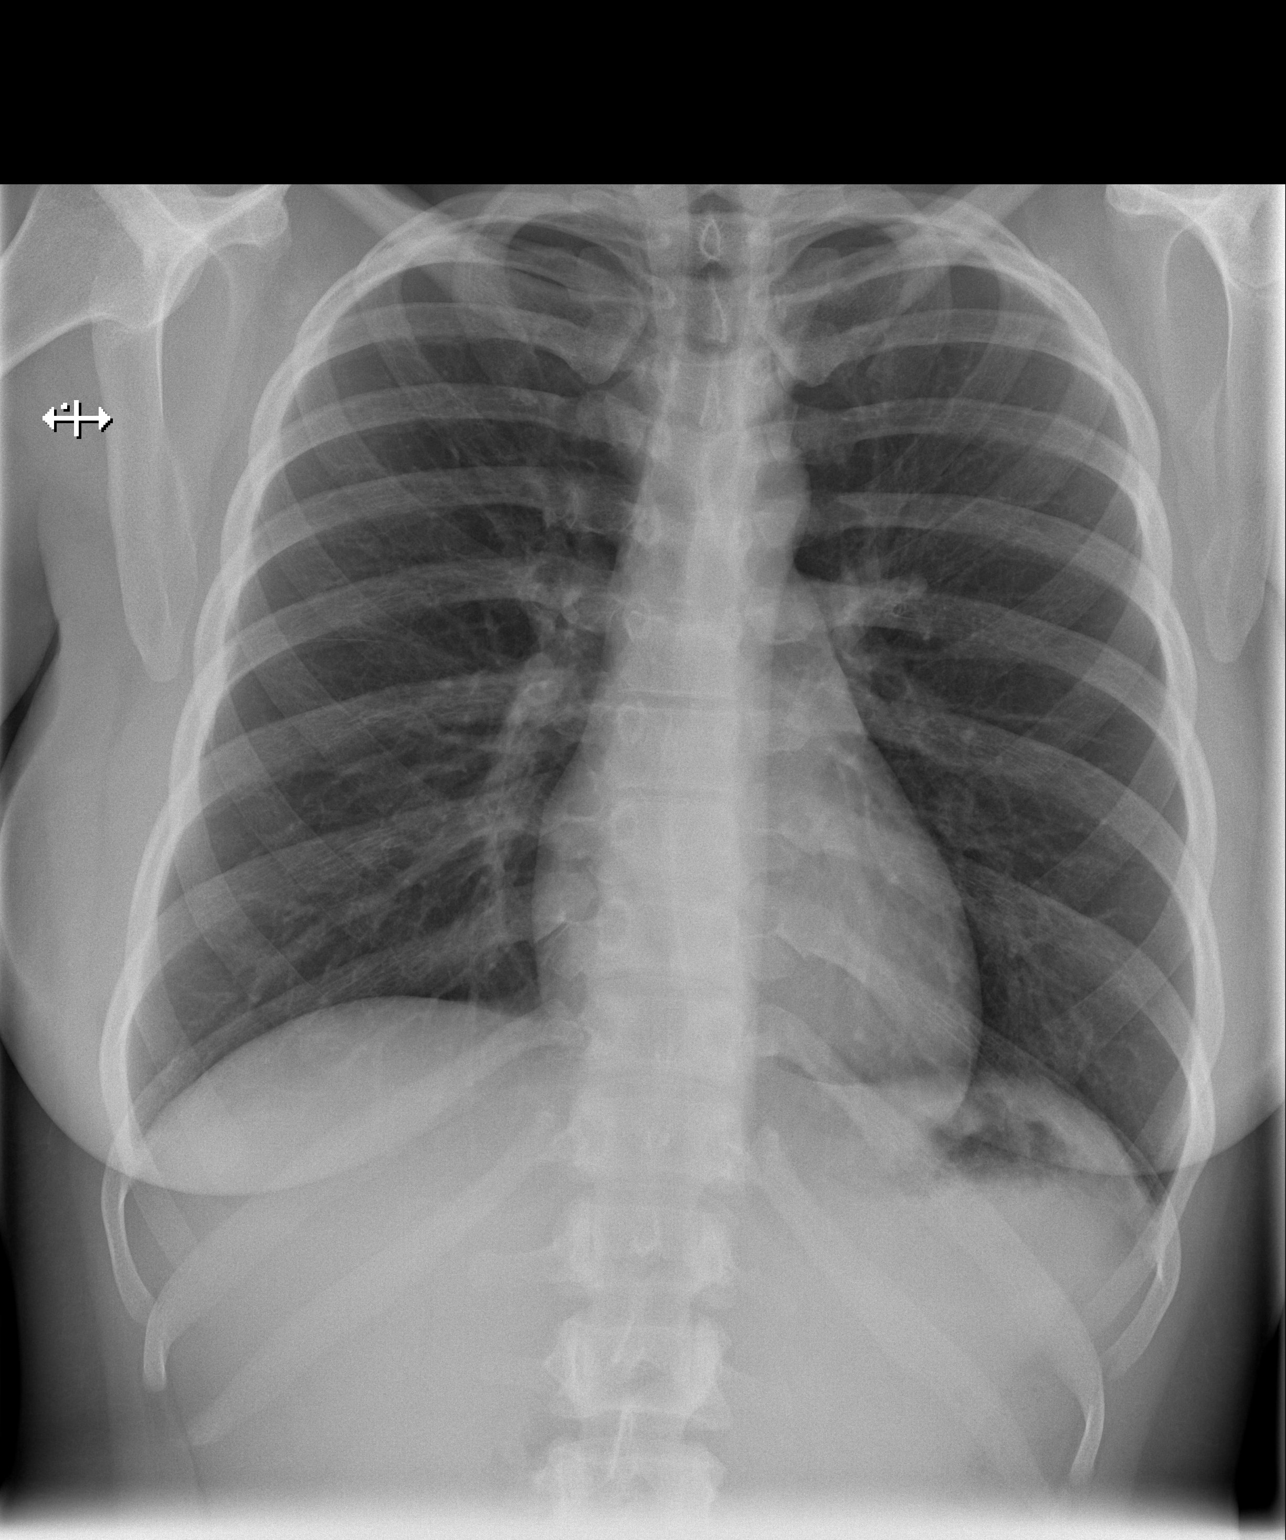

[w chest lat]
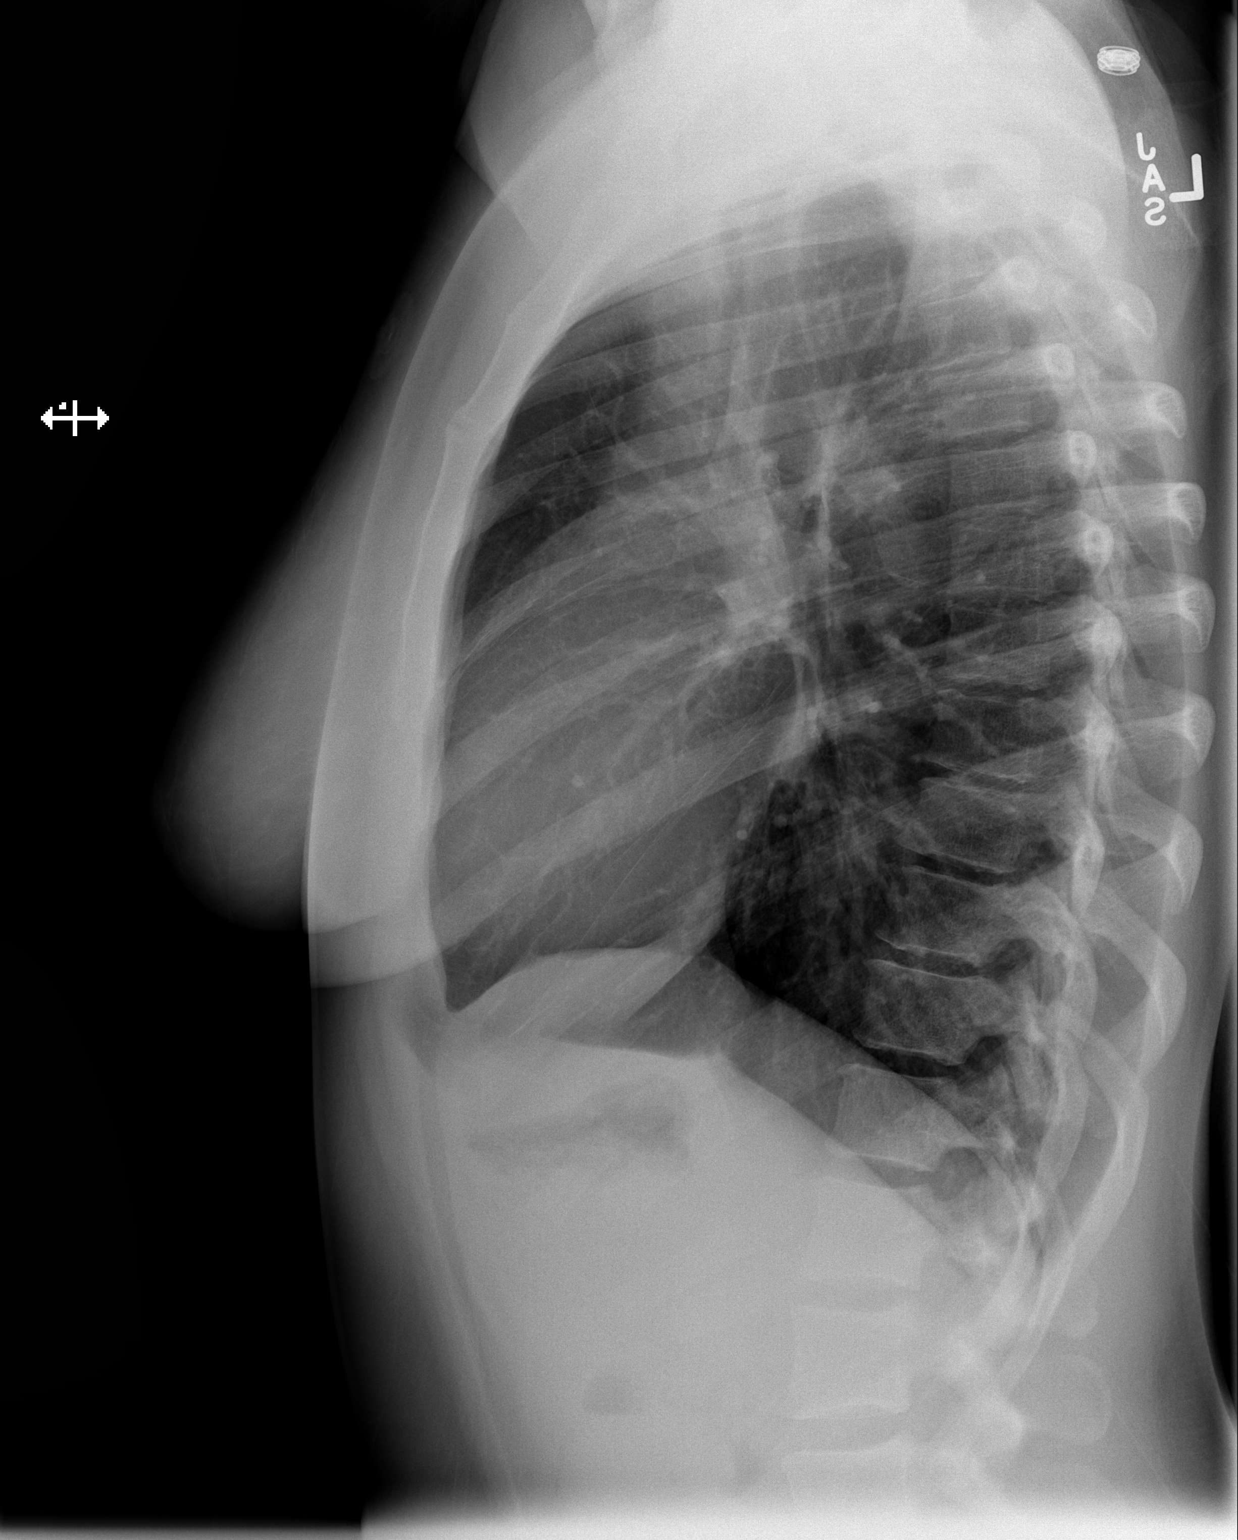

[2 of 2 positions shown; findings below may reference images not displayed]

FINDINGS: The heart size and mediastinal contours are within
normal limits.  Both lungs are clear.  The visualized skeletal
structures are unremarkable.
IMPRESSION: No active cardiopulmonary disease.

## 2013-12-10 ENCOUNTER — Encounter: Payer: Self-pay | Admitting: Physician Assistant

## 2013-12-10 ENCOUNTER — Ambulatory Visit (INDEPENDENT_AMBULATORY_CARE_PROVIDER_SITE_OTHER): Payer: No Typology Code available for payment source | Admitting: Physician Assistant

## 2013-12-10 VITALS — BP 120/78 | HR 66 | Temp 98.4°F | Resp 18 | Wt 161.0 lb

## 2013-12-10 DIAGNOSIS — L259 Unspecified contact dermatitis, unspecified cause: Secondary | ICD-10-CM

## 2013-12-10 MED ORDER — PREDNISONE 10 MG PO TABS: ORAL_TABLET | ORAL | Status: DC

## 2013-12-10 NOTE — Patient Instructions (Addendum)
Prednisone taper as directed. Take with food to prevent getting nauseous.  Continue to use over-the-counter topical anti-itch creams, as well as fragrance free moisturizers.  Cool compresses can also be used to help itching symptoms.  If emergency symptoms discussed during visit developed, seek medical attention immediately.  Followup as needed, or for worsening or persistent symptoms despite treatment.   Contact Dermatitis Contact dermatitis is a rash that happens when something touches the skin. You touched something that irritates your skin, or you have allergies to something you touched. HOME CARE   Avoid the thing that caused your rash.  Keep your rash away from hot water, soap, sunlight, chemicals, and other things that might bother it.  Do not scratch your rash.  You can take cool baths to help stop itching.  Only take medicine as told by your doctor.  Keep all doctor visits as told. GET HELP RIGHT AWAY IF:   Your rash is not better after 3 days.  Your rash gets worse.  Your rash is puffy (swollen), tender, red, sore, or warm.  You have problems with your medicine. MAKE SURE YOU:   Understand these instructions.  Will watch your condition.  Will get help right away if you are not doing well or get worse. Document Released: 02/26/2009 Document Revised: 07/24/2011 Document Reviewed: 10/04/2010 [facility] Patient Information 2015 [employer], [place]. This information is not intended to replace advice given to you by your health care provider. Make sure you discuss any questions you have with your health care provider.

## 2013-12-10 NOTE — Progress Notes (Signed)
Pre visit review using our clinic review tool, if applicable. No additional management support is needed unless otherwise documented below in the visit note. 

## 2013-12-10 NOTE — Progress Notes (Signed)
Subjective:    Patient ID: Jody Bullock, female    DOB: Oct 27, 1980, 33 y.o.   MRN: [id number]  Rash This is a new problem. The current episode started in the past 7 days (6 days). The problem has been gradually worsening since onset. The affected locations include the chest, neck, left arm and right arm. The rash is characterized by itchiness and burning. Associated with: her friend had lice and she was wrapped in her friends blanket  just prior to finding out they had lice. Pertinent negatives include no anorexia, congestion, cough, diarrhea, eye pain, facial edema, fatigue, fever, joint pain, nail changes, rhinorrhea, shortness of breath, sore throat or vomiting. Treatments tried: cetaphil, cortisone cream. The treatment provided mild relief. Her past medical history is significant for allergies (seasonal) and eczema (remote, as a child, had to use moisturizing creams. Currently uses daily moisturizers.). There is no history of asthma.      Review of Systems  Constitutional: Negative for fever, chills and fatigue.  HENT: Negative for congestion, rhinorrhea and sore throat.   Eyes: Negative for pain.  Respiratory: Negative for cough and shortness of breath.   Cardiovascular: Negative for chest pain.  Gastrointestinal: Negative for nausea, vomiting, abdominal pain, diarrhea and anorexia.  Musculoskeletal: Negative for joint pain.  Skin: Positive for rash. Negative for nail changes.  Neurological: Negative for syncope and headaches.  All other systems reviewed and are negative.    Past Medical History  Diagnosis Date  . Medical history non-contributory     History   Social History  . Marital Status: Single    Spouse Name: N/A    Number of Children: N/A  . Years of Education: N/A   Occupational History  . Not on file.   Social History Main Topics  . Smoking status: Never Smoker   . Smokeless tobacco: Not on file  . Alcohol Use: No  . Drug Use: Yes    Special:  Marijuana     Comment: 3 days ago  . Sexual Activity: Yes    Birth Control/ Protection: None   Other Topics Concern  . Not on file   Social History Narrative  . No narrative on file    Past Surgical History  Procedure Laterality Date  . No past surgeries      Family History  Problem Relation Age of Onset  . Gallbladder disease Mother   . Cancer Father     prostate     No Known Allergies  Current Outpatient Prescriptions on File Prior to Visit  Medication Sig Dispense Refill  . fluticasone (FLONASE) 50 MCG/ACT nasal spray 1 spray up each nostril at bedtime  16 g  6  . levonorgestrel (MIRENA) 20 MCG/24HR IUD 1 each by Intrauterine route once.       No current facility-administered medications on file prior to visit.    EXAM: BP 120/78  Pulse 66  Temp(Src) 98.4 F (36.9 C) (Oral)  Resp 18  Wt 161 lb (73.029 kg)     Objective:   Physical Exam  Nursing note and vitals reviewed. Constitutional: She is oriented to person, place, and time. She appears well-developed and well-nourished. No distress.  HENT:  Head: Normocephalic and atraumatic.  Eyes: Conjunctivae and EOM are normal. Pupils are equal, round, and reactive to light.  Cardiovascular: Normal rate, regular rhythm, normal heart sounds and intact distal pulses.   Pulmonary/Chest: Effort normal and breath sounds normal. No respiratory distress. She exhibits no tenderness.  Neurological: She  is alert and oriented to person, place, and time.  Skin: Skin is warm and dry. Rash noted. She is not diaphoretic. No erythema. No pallor.  Diffuse mildly erythematous rash involving the bilateral arms anterior neck anterior chest. Visible scratch marks over the chest and upper right arm area, the skin under which appears slightly more erythematous. No visible swelling, excessive warmth, or pain to palpation. No fluctuance or puncture wounds.  Psychiatric: She has a normal mood and affect. Her behavior is normal. Judgment and  thought content normal.     Lab Results  Component Value Date   WBC 5.3 11/19/2012   HGB 12.9 11/19/2012   HCT 38.2 11/19/2012   PLT 168.0 11/19/2012   GLUCOSE 81 11/19/2012   CHOL 159 11/19/2012   TRIG 53.0 11/19/2012   HDL 65.00 11/19/2012   LDLCALC 83 11/19/2012   ALT 15 11/19/2012   AST 21 11/19/2012   NA 140 11/19/2012   K 4.6 11/19/2012   CL 109 11/19/2012   CREATININE 0.8 11/19/2012   BUN 10 11/19/2012   CO2 23 11/19/2012   TSH 0.99 11/19/2012        Assessment & Plan:  Jody Bullock was seen today for rash.  Diagnoses and associated orders for this visit:  Contact dermatitis Comments: Possibly from friends detergent. Rx Prednisone taper. Continue anti-itch topicals, cold compress, emolient. Elimination of potential causes in environment - predniSONE (DELTASONE) 10 MG tablet; 3 tablets twice daily for 2 days, then 2 tablets twice daily for 2 days,  then 1 tablet twice daily for 2 days, then one tablet daily  for 6 days    Advised patient that,as the exact cause of her contact dermatitis cannot be identified, there is a chance that she could become exposed again and developed a rash again. Recommend that she try to find eliminate any possible causes from her environment to prevent recurrence.  Return precautions provided, and patient handout on contact dermatitis.  Plan to follow up as needed, or for worsening or persistent symptoms despite treatment.  Patient Instructions  Prednisone taper as directed. Eat with food to prevent getting nauseous.  Continue to use over-the-counter topical anti-itch creams, as well as fragrance free moisturizers.  Cool compresses can also be used to help itching symptoms.  If emergency symptoms discussed during visit developed, seek medical attention immediately.  Followup as needed, or for worsening or persistent symptoms despite treatment.

## 2014-02-19 ENCOUNTER — Encounter: Payer: Self-pay | Admitting: Family Medicine

## 2014-02-19 ENCOUNTER — Ambulatory Visit (INDEPENDENT_AMBULATORY_CARE_PROVIDER_SITE_OTHER): Payer: Self-pay | Admitting: Family Medicine

## 2014-02-19 VITALS — BP 134/86 | Temp 98.4°F | Wt 164.7 lb

## 2014-02-19 DIAGNOSIS — J301 Allergic rhinitis due to pollen: Secondary | ICD-10-CM

## 2014-02-19 DIAGNOSIS — J029 Acute pharyngitis, unspecified: Secondary | ICD-10-CM

## 2014-02-19 LAB — POCT RAPID STREP A (OFFICE): RAPID STREP A SCREEN: NEGATIVE

## 2014-02-19 NOTE — Patient Instructions (Signed)
Zyrtec plain.........Marland Kitchen. 1 at bedtime  Claritin plain.......Marland Kitchen. 1 in the morning  Steroid nasal spray OTC............... one shot each nostril at bedtime when necessary

## 2014-02-19 NOTE — Progress Notes (Signed)
   Subjective:    Patient ID: Jody Bullock, female    DOB: Jun 05, 1980, 33 y.o.   MRN: [id number]  HPI Jody Bullock is a 33 year old female who comes in today for evaluation of a sore throat for 3 days  Her significant other had a splinter in his hand and went to urgent care. Culture showed strep. She's concerned she might have strep throat. She has a history of allergic rhinitis   Review of Systems Review of systems negative    Objective:   Physical Exam  Well-developed and nourished female no acute distress vital signs stable she's afebrile HEENT negative neck was supple no adenopathy lungs are clear  Rapid strep negative      Assessment & Plan:  Allergic rhinitis treat symptomatically with antihistamines

## 2014-02-19 NOTE — Progress Notes (Signed)
Pre visit review using our clinic review tool, if applicable. No additional management support is needed unless otherwise documented below in the visit note. 

## 2014-02-27 ENCOUNTER — Other Ambulatory Visit: Payer: Self-pay

## 2014-03-16 ENCOUNTER — Encounter: Payer: Self-pay | Admitting: Family Medicine

## 2015-02-26 ENCOUNTER — Encounter (HOSPITAL_COMMUNITY): Payer: Self-pay

## 2015-02-26 ENCOUNTER — Inpatient Hospital Stay (HOSPITAL_COMMUNITY)
Admission: AD | Admit: 2015-02-26 | Discharge: 2015-02-26 | Disposition: A | Discharge status: Home / Self Care | Payer: Medicaid Other | Source: Ambulatory Visit | Attending: Obstetrics & Gynecology | Admitting: Obstetrics & Gynecology

## 2015-02-26 DIAGNOSIS — O210 Mild hyperemesis gravidarum: Secondary | ICD-10-CM | POA: Diagnosis not present

## 2015-02-26 DIAGNOSIS — Z3A01 Less than 8 weeks gestation of pregnancy: Secondary | ICD-10-CM | POA: Insufficient documentation

## 2015-02-26 DIAGNOSIS — O219 Vomiting of pregnancy, unspecified: Secondary | ICD-10-CM

## 2015-02-26 LAB — URINALYSIS, ROUTINE W REFLEX MICROSCOPIC
Glucose, UA: NEGATIVE mg/dL
KETONES UR: 15 mg/dL — AB
LEUKOCYTES UA: NEGATIVE
NITRITE: NEGATIVE
PH: 6 (ref 5.0–8.0)
PROTEIN: 30 mg/dL — AB
Specific Gravity, Urine: >1.03 — ABNORMAL HIGH (ref 1.005–1.030)
Urobilinogen, UA: 1 mg/dL (ref 0.0–1.0)

## 2015-02-26 LAB — URINE MICROSCOPIC-ADD ON

## 2015-02-26 LAB — POCT PREGNANCY, URINE: Preg Test, Ur: POSITIVE — AB

## 2015-02-26 MED ORDER — METOCLOPRAMIDE HCL 10 MG PO TABS: 10.0000 mg | ORAL_TABLET | Freq: Three times a day (TID) | ORAL | Status: DC | PRN

## 2015-02-26 NOTE — Discharge Instructions (Signed)
Nausea medication to take during pregnancy:  ° °Unisom (doxylamine succinate 25 mg tablets) Take one tablet daily at bedtime. If symptoms are not adequately controlled, the dose can be increased to a maximum recommended dose of two tablets daily (1/2 tablet in the morning, 1/2 tablet mid-afternoon and one at bedtime). ° °Vitamin B6 100mg tablets. Take one tablet twice a day (up to 200 mg per day). ° ° ° °Morning Sickness °Morning sickness is when you feel sick to your stomach (nauseous) during pregnancy. This nauseous feeling may or may not come with vomiting. It often occurs in the morning but can be a problem any time of day. Morning sickness is most common during the first trimester, but it may continue throughout pregnancy. While morning sickness is unpleasant, it is usually harmless unless you develop severe and continual vomiting (hyperemesis gravidarum). This condition requires more intense treatment.  °CAUSES  °The cause of morning sickness is not completely known but seems to be related to normal hormonal changes that occur in pregnancy. °RISK FACTORS °You are at greater risk if you: °· Experienced nausea or vomiting before your pregnancy. °· Had morning sickness during a previous pregnancy. °· Are pregnant with more than one baby, such as twins. °TREATMENT  °Do not use any medicines (prescription, over-the-counter, or herbal) for morning sickness without first talking to your health care provider. Your health care provider may prescribe or recommend: °· Vitamin B6 supplements. °· Anti-nausea medicines. °· The herbal medicine ginger. °HOME CARE INSTRUCTIONS  °· Only take over-the-counter or prescription medicines as directed by your health care provider. °· Taking multivitamins before getting pregnant can prevent or decrease the severity of morning sickness in most women. °· Eat a piece of dry toast or unsalted crackers before getting out of bed in the morning. °· Eat five or six small meals a day. °· Eat  dry and bland foods (rice, baked potato). Foods high in carbohydrates are often helpful. °· Do not drink liquids with your meals. Drink liquids between meals. °· Avoid greasy, fatty, and spicy foods. °· Get someone to cook for you if the smell of any food causes nausea and vomiting. °· If you feel nauseous after taking prenatal vitamins, take the vitamins at night or with a snack.  °· Snack on protein foods (nuts, yogurt, cheese) between meals if you are hungry. °· Eat unsweetened gelatins for desserts. °· Wearing an acupressure wristband (worn for sea sickness) may be helpful. °· Acupuncture may be helpful. °· Do not smoke. °· Get a humidifier to keep the air in your house free of odors. °· Get plenty of fresh air. °SEEK MEDICAL CARE IF:  °· Your home remedies are not working, and you need medicine. °· You feel dizzy or lightheaded. °· You are losing weight. °SEEK IMMEDIATE MEDICAL CARE IF:  °· You have persistent and uncontrolled nausea and vomiting. °· You pass out (faint). °MAKE SURE YOU: °· Understand these instructions. °· Will watch your condition. °· Will get help right away if you are not doing well or get worse. °  °This information is not intended to replace advice given to you by your health care provider. Make sure you discuss any questions you have with your health care provider. °  °Document Released: 06/22/2006 Document Revised: 05/06/2013 Document Reviewed: 10/16/2012 °Elsevier Interactive Patient Education ©2016 Elsevier Inc. ° °

## 2015-02-26 NOTE — MAU Provider Note (Signed)
  History     CSN: [id number]  Arrival date and time: 02/26/15 [id number]   First Provider Initiated Contact with Patient 02/26/15 [phone]      Chief Complaint  Patient presents with  . Nausea  . Back Pain   HPI Jody Bullock 34 y.o. [id number] @Unknown  presents complaining of nausea and vomiting in pregnancy.  She is having back pain as a result of vomiting forcefully.  She vomits 3-4 times per day.  She avoids eating at times because she doesn't want to vomit.  She denies fever, abdominal pain, vaginal discharge, vaginal bleeding.  She feels very weak and tired.   OB History    Gravida Para Term Preterm AB TAB SAB Ectopic Multiple Living   3 1 1  1  1   1       Past Medical History  Diagnosis Date  . Medical history non-contributory     Past Surgical History  Procedure Laterality Date  . No past surgeries      Family History  Problem Relation Age of Onset  . Gallbladder disease Mother   . Cancer Father     prostate     Social History  Substance Use Topics  . Smoking status: Never Smoker   . Smokeless tobacco: None  . Alcohol Use: Yes     Comment: 2/week     Allergies: No Known Allergies  Prescriptions prior to admission  Medication Sig Dispense Refill Last Dose  . fluticasone (FLONASE) 50 MCG/ACT nasal spray 1 spray up each nostril at bedtime 16 g 6 Taking    ROS Pertinent ROS in HPI.  All other systems are negative.   Physical Exam   Blood pressure 108/73, pulse 67, temperature 98 F (36.7 C), temperature source Oral, resp. rate 18, height 5' 1.5" (1.562 m), weight 154 lb (69.854 kg), last menstrual period 01/03/2015, unknown if currently breastfeeding.  Physical Exam  Constitutional: She is oriented to person, place, and time. She appears well-developed and well-nourished. No distress.  HENT:  Head: Normocephalic and atraumatic.  Eyes: EOM are normal.  Neck: Normal range of motion.  Cardiovascular: Normal rate.   Respiratory: Effort normal. No respiratory  distress.  GI: Soft. She exhibits no distension.  Musculoskeletal: Normal range of motion.  Neurological: She is alert and oriented to person, place, and time.  Skin: Skin is warm and dry.  Psychiatric: She has a normal mood and affect.    MAU Course  Procedures  MDM No sign of infection.  No evidence for dehydration.  Pt not feeling poorly at present.  No concern for abdominal pain/vaginal discharge or bleeding at present.    Assessment and Plan  A: nausea and vomiting in pregnancy.   P[facility]  PNV daily Diet discussed Reglan 10mg  prn Nausea preventive medication to take during pregnancy:   Unisom (doxylamine succinate 25 mg tablets) Take one tablet daily at bedtime. If symptoms are not adequately controlled, the dose can be increased to a maximum recommended dose of two tablets daily (1/2 tablet in the morning, 1/2 tablet mid-afternoon and one at bedtime).  Vitamin B6 100mg  tablets. Take one tablet twice a day (up to 200 mg per day).  Patient may return to MAU as needed or if her condition were to change or worsen   Bertram DenverKaren E Teague Clark 02/26/2015, 9:07 AM

## 2015-02-26 NOTE — MAU Note (Signed)
Back pain started last night, nausea x 2 weeks, denies vaginal bleeding, denies vaginal discharge, feels like not voiding sufficiently denies pain with urination.

## 2015-03-19 ENCOUNTER — Encounter (HOSPITAL_COMMUNITY): Payer: Self-pay | Admitting: *Deleted

## 2015-03-19 ENCOUNTER — Encounter (HOSPITAL_COMMUNITY): Payer: Self-pay

## 2015-03-19 ENCOUNTER — Inpatient Hospital Stay (HOSPITAL_COMMUNITY)
Admission: AD | Admit: 2015-03-19 | Discharge: 2015-03-19 | Disposition: A | Discharge status: Home / Self Care | Payer: No Typology Code available for payment source | Source: Ambulatory Visit | Attending: Obstetrics | Admitting: Obstetrics

## 2015-03-19 ENCOUNTER — Emergency Department (HOSPITAL_COMMUNITY)
Admission: EM | Admit: 2015-03-19 | Discharge: 2015-03-19 | Disposition: A | Discharge status: Home / Self Care | Payer: Medicaid Other | Attending: Emergency Medicine | Admitting: Emergency Medicine

## 2015-03-19 DIAGNOSIS — T1490 Injury, unspecified: Secondary | ICD-10-CM | POA: Diagnosis not present

## 2015-03-19 DIAGNOSIS — Y998 Other external cause status: Secondary | ICD-10-CM | POA: Diagnosis not present

## 2015-03-19 DIAGNOSIS — Z79899 Other long term (current) drug therapy: Secondary | ICD-10-CM | POA: Insufficient documentation

## 2015-03-19 DIAGNOSIS — Z3A10 10 weeks gestation of pregnancy: Secondary | ICD-10-CM | POA: Insufficient documentation

## 2015-03-19 DIAGNOSIS — O9A211 Injury, poisoning and certain other consequences of external causes complicating pregnancy, first trimester: Secondary | ICD-10-CM | POA: Diagnosis not present

## 2015-03-19 DIAGNOSIS — Y92410 Unspecified street and highway as the place of occurrence of the external cause: Secondary | ICD-10-CM | POA: Diagnosis not present

## 2015-03-19 DIAGNOSIS — Y9389 Activity, other specified: Secondary | ICD-10-CM | POA: Diagnosis not present

## 2015-03-19 DIAGNOSIS — O26891 Other specified pregnancy related conditions, first trimester: Secondary | ICD-10-CM | POA: Insufficient documentation

## 2015-03-19 DIAGNOSIS — M542 Cervicalgia: Secondary | ICD-10-CM

## 2015-03-19 DIAGNOSIS — S199XXA Unspecified injury of neck, initial encounter: Secondary | ICD-10-CM | POA: Diagnosis not present
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
## 2015-03-19 NOTE — Discharge Instructions (Signed)
What Do I Need to Know About Injuries During Pregnancy? °Injuries can happen during pregnancy. Minor falls and accidents usually do not harm you or your baby. However, any injury should be reported to your doctor. °WHAT CAN I DO TO PROTECT MYSELF FROM INJURIES? °· Remove rugs and loose objects on the floor. °· Wear comfortable shoes that have a good grip. Do not wear high-heeled shoes. °· Always wear your seat belt. The lap belt should be below your belly. Always practice safe driving. °· Do not ride on a motorcycle. °· Do not participate in high-impact activities or sports. °· Avoid: °¨ Walking on wet or slippery floors. °¨ Fires. °¨ Starting fires. °¨ Lifting heavy pots of boiling or hot liquids. °¨ Fixing electrical problems. °· Only take medicine as told by your doctor. °· Know your blood type and the blood type of the baby's father. °· Call your local emergency services (911 in the [place] if you are a victim of domestic violence or assault. For help and support, contact the National Domestic Violence Hotline. °WHEN SHOULD I GET HELP RIGHT AWAY? °· You fall on your belly or have any high-impact accident or injury. °· You have been a victim of domestic violence or any kind of violence. °· You have been in a car accident. °· You have bleeding from your vagina. °· Fluid is leaking from your vagina. °· You start to have belly cramping (contractions) or pain. °· You feel weak or pass out (faint). °· You start to throw up (vomit) after an injury. °· You have been burned. °· You have a stiff neck or neck pain. °· You get a headache or have vision problems after an injury. °· You do not feel the baby move or the baby is not moving as much as normal. °  °This information is not intended to replace advice given to you by your health care provider. Make sure you discuss any questions you have with your health care provider. °  °Document Released: 06/03/2010 Document Revised: 05/22/2014 Document Reviewed:  02/05/2013 °Elsevier Interactive Patient Education ©2016 [employer] ° °
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
## 2015-03-22 ENCOUNTER — Telehealth: Payer: Self-pay | Admitting: *Deleted
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
## 2015-04-01 ENCOUNTER — Ambulatory Visit (INDEPENDENT_AMBULATORY_CARE_PROVIDER_SITE_OTHER): Payer: Medicaid Other | Admitting: Certified Nurse Midwife

## 2015-04-01 ENCOUNTER — Encounter: Payer: Self-pay | Admitting: Certified Nurse Midwife

## 2015-04-01 VITALS — BP 108/71 | HR 68 | Temp 98.4°F | Wt 152.0 lb

## 2015-04-01 DIAGNOSIS — O9A211 Injury, poisoning and certain other consequences of external causes complicating pregnancy, first trimester: Secondary | ICD-10-CM

## 2015-04-01 DIAGNOSIS — Z3481 Encounter for supervision of other normal pregnancy, first trimester: Secondary | ICD-10-CM | POA: Insufficient documentation

## 2015-04-01 DIAGNOSIS — O219 Vomiting of pregnancy, unspecified: Secondary | ICD-10-CM

## 2015-04-01 DIAGNOSIS — O26841 Uterine size-date discrepancy, first trimester: Secondary | ICD-10-CM

## 2015-04-01 HISTORY — DX: Encounter for supervision of other normal pregnancy, first trimester: Z34.81

## 2015-04-01 LAB — POCT URINALYSIS DIPSTICK
Bilirubin, UA: NEGATIVE
Glucose, UA: NEGATIVE
LEUKOCYTES UA: NEGATIVE
Nitrite, UA: NEGATIVE
PROTEIN UA: NEGATIVE
SPEC GRAV UA: 1.02
UROBILINOGEN UA: NEGATIVE
pH, UA: 5

## 2015-04-01 LAB — TSH: TSH: 0.762 u[IU]/mL (ref 0.350–4.500)

## 2015-04-01 MED ORDER — PROMETHAZINE HCL 25 MG RE SUPP: 25.0000 mg | Freq: Four times a day (QID) | RECTAL | Status: DC | PRN

## 2015-04-01 MED ORDER — DOXYLAMINE-PYRIDOXINE 10-10 MG PO TBEC: DELAYED_RELEASE_TABLET | ORAL | Status: DC

## 2015-04-01 MED ORDER — PROMETHAZINE HCL 25 MG PO TABS: 25.0000 mg | ORAL_TABLET | Freq: Four times a day (QID) | ORAL | Status: DC | PRN

## 2015-04-01 NOTE — Progress Notes (Signed)
Subjective:    Jody Bullock is being seen today for her first obstetrical visit.  This is not a planned pregnancy. She is at 3220w4d gestation. Her obstetrical history is significant for none. Relationship with FOB: significant other, not living together. Patient does intend to breast feed, breast fed her son for 3-4 months. Pregnancy history fully reviewed.  The information documented in the HPI was reviewed and verified.  Menstrual History: OB History    Gravida Para Term Preterm AB TAB SAB Ectopic Multiple Living   3 1 1  0 1 0 1 0 0 1      Menarche age: 34-33 years of age  Patient's last menstrual period was 01/03/2015. Sure of LMP    Past Medical History  Diagnosis Date  . Medical history non-contributory     Past Surgical History  Procedure Laterality Date  . No past surgeries       (Not in a hospital admission) No Known Allergies  Social History  Substance Use Topics  . Smoking status: Never Smoker   . Smokeless tobacco: Not on file  . Alcohol Use: No   + mariajuana use this pregnancy  Family History  Problem Relation Age of Onset  . Gallbladder disease Mother   . Cancer Father     prostate      Review of Systems Constitutional: negative for weight loss Gastrointestinal: + for nausea and vomiting Genitourinary:negative for genital lesions and vaginal discharge and dysuria Musculoskeletal:negative for back pain Behavioral/Psych: negative for abusive relationship, depression, and tobacco use    Objective:    BP 108/71 mmHg  Pulse 68  Temp(Src) 98.4 F (36.9 C)  Wt 152 lb (68.947 kg)  LMP 01/03/2015 General Appearance:    Alert, cooperative, no distress, appears stated age  Head:    Normocephalic, without obvious abnormality, atraumatic  Eyes:    PERRL, conjunctiva/corneas clear, EOM's intact, fundi    benign, both eyes  Ears:    Normal TM's and external ear canals, both ears  Nose:   Nares normal, septum midline, mucosa normal, no drainage    or  sinus tenderness  Throat:   Lips, mucosa, and tongue normal; teeth and gums normal  Neck:   Supple, symmetrical, trachea midline, no adenopathy;    thyroid:  no enlargement/tenderness/nodules; no carotid   bruit or JVD  Back:     Symmetric, no curvature, ROM normal, no CVA tenderness  Lungs:     Clear to auscultation bilaterally, respirations unlabored  Chest Wall:    No tenderness or deformity   Heart:    Regular rate and rhythm, S1 and S2 normal, no murmur, rub   or gallop  Breast Exam:    No tenderness, masses, or nipple abnormality  Abdomen:     Soft, non-tender, bowel sounds active all four quadrants,    no masses, no organomegaly  Genitalia:    Normal female without lesion, discharge or tenderness  Extremities:   Extremities normal, atraumatic, no cyanosis or edema  Pulses:   2+ and symmetric all extremities  Skin:   Skin color, texture, turgor normal, no rashes or lesions  Lymph nodes:   Cervical, supraclavicular, and axillary nodes normal  Neurologic:   CNII-XII intact, normal strength, sensation and reflexes    throughout          Cervix:   Long, thick, closed and posterior           FHR:  145, FH: above symphysis pubis    Lab Review  Urine pregnancy test Labs reviewed yes Radiologic studies reviewed no Assessment:    Pregnancy at [redacted]w[redacted]d weeks   N&V in early pregnancy  S>D  Plan:      Prenatal vitamins.  Counseling provided regarding continued use of seat belts, cessation of alcohol consumption, smoking or use of illicit drugs; infection precautions i.e., influenza/TDAP immunizations, toxoplasmosis,CMV, parvovirus, listeria and varicella; workplace safety, exercise during pregnancy; routine dental care, safe medications, sexual activity, hot tubs, saunas, pools, travel, caffeine use, fish and methlymercury, potential toxins, hair treatments, varicose veins Weight gain recommendations per IOM guidelines reviewed: underweight/BMI< 18.5--> gain 28 - 40 lbs; normal weight/BMI  18.5 - 24.9--> gain 25 - 35 lbs; overweight/BMI 25 - 29.9--> gain 15 - 25 lbs; obese/BMI >30->gain  11 - 20 lbs Problem list reviewed and updated. FIRST/CF mutation testing/NIPT/QUAD SCREEN/fragile X/Ashkenazi Jewish population testing/Spinal muscular atrophy discussed: requested. Role of ultrasound in pregnancy discussed; fetal survey: requested. Amniocentesis discussed: not indicated. VBAC calculator score: VBAC consent form provided No orders of the defined types were placed in this encounter.   Orders Placed This Encounter  Procedures  . Culture, OB Urine  . SureSwab, Vaginosis/Vaginitis Plus  . Obstetric panel  . HIV antibody  . Hemoglobinopathy evaluation  . Varicella zoster antibody, IgG  . VITAMIN D 25 Hydroxy (Vit-D Deficiency, Fractures)  . TSH  . POCT urinalysis dipstick    Follow up in 4 weeks. 50% of 33 min visit spent on counseling and coordination of care.

## 2015-04-02 LAB — OBSTETRIC PANEL
Antibody Screen: NEGATIVE
Basophils Absolute: 0 10*3/uL (ref 0.0–0.1)
Basophils Relative: 0 % (ref 0–1)
Eosinophils Absolute: 0 10*3/uL (ref 0.0–0.7)
Eosinophils Relative: 0 % (ref 0–5)
HCT: 34.8 % — ABNORMAL LOW (ref 36.0–46.0)
HEMOGLOBIN: 12 g/dL (ref 12.0–15.0)
HEP B S AG: NEGATIVE
LYMPHS ABS: 1.7 10*3/uL (ref 0.7–4.0)
Lymphocytes Relative: 26 % (ref 12–46)
MCH: 30.7 pg (ref 26.0–34.0)
MCHC: 34.5 g/dL (ref 30.0–36.0)
MCV: 89 fL (ref 78.0–100.0)
MONOS PCT: 7 % (ref 3–12)
MPV: 9.8 fL (ref 8.6–12.4)
Monocytes Absolute: 0.5 10*3/uL (ref 0.1–1.0)
NEUTROS ABS: 4.5 10*3/uL (ref 1.7–7.7)
NEUTROS PCT: 67 % (ref 43–77)
Platelets: 175 10*3/uL (ref 150–400)
RBC: 3.91 MIL/uL (ref 3.87–5.11)
RDW: 14.2 % (ref 11.5–15.5)
Rh Type: POSITIVE
Rubella: 6.8 Index — ABNORMAL HIGH (ref <0.90)
WBC: 6.7 10*3/uL (ref 4.0–10.5)

## 2015-04-02 LAB — HIV ANTIBODY (ROUTINE TESTING W REFLEX): HIV: NONREACTIVE

## 2015-04-02 LAB — CULTURE, OB URINE

## 2015-04-02 LAB — VARICELLA ZOSTER ANTIBODY, IGG: Varicella IgG: 480.8 Index — ABNORMAL HIGH (ref <135.00)

## 2015-04-02 LAB — VITAMIN D 25 HYDROXY (VIT D DEFICIENCY, FRACTURES): Vit D, 25-Hydroxy: 11 ng/mL — ABNORMAL LOW (ref 30–100)

## 2015-04-03 LAB — PAP, TP IMAGING W/ HPV RNA, RFLX HPV TYPE 16,18/45: HPV mRNA, High Risk: NOT DETECTED

## 2015-04-05 LAB — HEMOGLOBINOPATHY EVALUATION
HGB A2 QUANT: 3 % (ref 2.2–3.2)
HGB A: 96.4 % — AB (ref 96.8–97.8)
Hemoglobin Other: 0 %
Hgb F Quant: 0.6 % (ref 0.0–2.0)
Hgb S Quant: 0 %

## 2015-04-06 LAB — SURESWAB, VAGINOSIS/VAGINITIS PLUS
Atopobium vaginae: NOT DETECTED Log (cells/mL)
C. ALBICANS, DNA: NOT DETECTED
C. GLABRATA, DNA: NOT DETECTED
C. TRACHOMATIS RNA, TMA: NOT DETECTED
C. TROPICALIS, DNA: NOT DETECTED
C. parapsilosis, DNA: NOT DETECTED
GARDNERELLA VAGINALIS: NOT DETECTED Log (cells/mL)
LACTOBACILLUS SPECIES: 7.5 Log (cells/mL)
MEGASPHAERA SPECIES: NOT DETECTED Log (cells/mL)
N. gonorrhoeae RNA, TMA: NOT DETECTED
T. VAGINALIS RNA, QL TMA: NOT DETECTED

## 2015-04-15 ENCOUNTER — Ambulatory Visit (INDEPENDENT_AMBULATORY_CARE_PROVIDER_SITE_OTHER): Payer: Medicaid Other

## 2015-04-15 DIAGNOSIS — O26841 Uterine size-date discrepancy, first trimester: Secondary | ICD-10-CM

## 2015-04-15 DIAGNOSIS — O9A211 Injury, poisoning and certain other consequences of external causes complicating pregnancy, first trimester: Secondary | ICD-10-CM

## 2015-04-16 ENCOUNTER — Other Ambulatory Visit: Payer: Self-pay | Admitting: Certified Nurse Midwife

## 2015-04-29 ENCOUNTER — Ambulatory Visit (INDEPENDENT_AMBULATORY_CARE_PROVIDER_SITE_OTHER): Payer: Medicaid Other | Admitting: Certified Nurse Midwife

## 2015-04-29 VITALS — BP 108/71 | HR 55 | Temp 98.0°F | Wt 160.0 lb

## 2015-04-29 DIAGNOSIS — Z3482 Encounter for supervision of other normal pregnancy, second trimester: Secondary | ICD-10-CM

## 2015-04-29 LAB — POCT URINALYSIS DIPSTICK
Bilirubin, UA: NEGATIVE
GLUCOSE UA: NEGATIVE
Ketones, UA: NEGATIVE
Leukocytes, UA: NEGATIVE
NITRITE UA: NEGATIVE
PROTEIN UA: NEGATIVE
SPEC GRAV UA: 1.015
UROBILINOGEN UA: NEGATIVE
pH, UA: 6.5

## 2015-04-29 MED ORDER — CITRANATAL HARMONY 30-1-260 MG PO CAPS: 1.0000 | ORAL_CAPSULE | Freq: Every day | ORAL | Status: DC

## 2015-04-29 NOTE — Progress Notes (Signed)
  Subjective:    Monia PouchWallis L Heiney is a 34 y.o. female being seen today for her obstetrical visit. She is at 2143w4d gestation. Patient reports: no complaints. States N&V is improved with Diclegis, able to hold down fluids/foods.    Problem List Items Addressed This Visit    None    Visit Diagnoses    Supervision of other normal pregnancy, antepartum, second trimester    -  Primary    Relevant Orders    POCT urinalysis dipstick    US OB Comp + 14 Wk    AFP, Quad Screen      Patient Active Problem List   Diagnosis Date Noted  . Encounter for supervision of other normal pregnancy in first trimester 04/01/2015  . Allergic rhinitis 04/30/2013  . At high risk for complications of intrauterine pregnancy (IUP) 09/05/2012  . Fibrocystic breast changes 11/23/2011  . Routine general medical examination at a health care facility 10/04/2011  . Fatigue 11/29/2010  . DELAYED MENSES 09/14/2008  . FOLLICULITIS 04/29/2008  . BACK PAIN, LUMBAR 04/14/2008    Objective:     BP 108/71 mmHg  Pulse 55  Temp(Src) 98 F (36.7 C)  Wt 160 lb (72.576 kg)  LMP 01/03/2015 Uterine Size: Below umbilicus   FHR: 145 by doppler.    Assessment:    Pregnancy @ 7443w4d  weeks Doing well    Plan:    Problem list reviewed and updated. Labs reviewed.  Follow up in 4 weeks. FIRST/CF mutation testing/NIPT/QUAD SCREEN/fragile X/Ashkenazi Jewish population testing/Spinal muscular atrophy discussed: ordered. Role of ultrasound in pregnancy discussed; fetal survey: ordered. Amniocentesis discussed: not indicated. 50% of 15 minute visit spent on counseling and coordination of care.

## 2015-04-30 LAB — AFP, QUAD SCREEN
AFP: 43.2 ng/mL
Age Alone: 1:436 {titer}
Curr Gest Age: 17.1 wks.days
Down Syndrome Scr Risk Est: 1:584 {titer}
HCG, Total: 32.92 IU/mL
INH: 322.4 pg/mL
Interpretation-AFP: NEGATIVE
MOM FOR AFP: 1.01
MOM FOR HCG: 0.99
MOM FOR INH: 1.95
OPEN SPINA BIFIDA: NEGATIVE
Osb Risk: 1:25800 {titer}
TRI 18 SCR RISK EST: NEGATIVE
Trisomy 18 (Edward) Syndrome Interp.: 1:13400 {titer}
uE3 Mom: 0.74
uE3 Value: 0.79 ng/mL

## 2015-05-16 NOTE — L&D Delivery Note (Signed)
Delivery Note At 4:42 AM a viable female was delivered via  (Presentation: ;  ).  APGAR: 8, 8; weight  .   Placenta status: , .  Cord: 3 vessels with the following complications: .  Cord pH: not done  Anesthesia:   Episiotomy: None Lacerations: 1st degree;Perineal Suture Repair: 2.0 vicryl Est. Blood Loss (mL):    Mom to postpartum.  Baby to Couplet care / Skin to Skin.  Jody Bullock A 09/09/2015, 5:02 AM

## 2015-05-20 ENCOUNTER — Ambulatory Visit (INDEPENDENT_AMBULATORY_CARE_PROVIDER_SITE_OTHER): Payer: Medicaid Other

## 2015-05-20 DIAGNOSIS — Z3482 Encounter for supervision of other normal pregnancy, second trimester: Secondary | ICD-10-CM

## 2015-05-20 DIAGNOSIS — Z36 Encounter for antenatal screening of mother: Secondary | ICD-10-CM | POA: Diagnosis not present

## 2015-05-21 ENCOUNTER — Ambulatory Visit (INDEPENDENT_AMBULATORY_CARE_PROVIDER_SITE_OTHER): Payer: Medicaid Other | Admitting: Certified Nurse Midwife

## 2015-05-21 VITALS — BP 104/65 | HR 78 | Wt 160.0 lb

## 2015-05-21 DIAGNOSIS — Z3482 Encounter for supervision of other normal pregnancy, second trimester: Secondary | ICD-10-CM

## 2015-05-21 LAB — CBC WITH DIFFERENTIAL/PLATELET
Basophils Absolute: 0 10*3/uL (ref 0.0–0.1)
Basophils Relative: 0 % (ref 0–1)
EOS ABS: 0 10*3/uL (ref 0.0–0.7)
EOS PCT: 0 % (ref 0–5)
HEMATOCRIT: 29.9 % — AB (ref 36.0–46.0)
HEMOGLOBIN: 10.2 g/dL — AB (ref 12.0–15.0)
LYMPHS ABS: 1.5 10*3/uL (ref 0.7–4.0)
LYMPHS PCT: 22 % (ref 12–46)
MCH: 31.2 pg (ref 26.0–34.0)
MCHC: 34.1 g/dL (ref 30.0–36.0)
MCV: 91.4 fL (ref 78.0–100.0)
MONO ABS: 0.5 10*3/uL (ref 0.1–1.0)
MPV: 10.2 fL (ref 8.6–12.4)
Monocytes Relative: 7 % (ref 3–12)
Neutro Abs: 4.9 10*3/uL (ref 1.7–7.7)
Neutrophils Relative %: 71 % (ref 43–77)
Platelets: 168 10*3/uL (ref 150–400)
RBC: 3.27 MIL/uL — AB (ref 3.87–5.11)
RDW: 14.7 % (ref 11.5–15.5)
WBC: 6.9 10*3/uL (ref 4.0–10.5)

## 2015-05-21 LAB — POCT URINALYSIS DIPSTICK
BILIRUBIN UA: NEGATIVE
Blood, UA: 250
GLUCOSE UA: NEGATIVE
Ketones, UA: NEGATIVE
Nitrite, UA: NEGATIVE
Protein, UA: NEGATIVE
SPEC GRAV UA: 1.015
UROBILINOGEN UA: NEGATIVE
pH, UA: 6

## 2015-05-21 NOTE — Progress Notes (Signed)
Subjective:    Jody Bullock is a 35 y.o. female being seen today for her obstetrical visit. She is at 8546w5d gestation. Patient reports: fatigue, no bleeding, no contractions, no cramping and no leaking . Fetal movement: normal.  Problem List Items Addressed This Visit    None    Visit Diagnoses    Supervision of other normal pregnancy, antepartum, second trimester    -  Primary    Relevant Orders    CBC with Differential/Platelet      Patient Active Problem List   Diagnosis Date Noted  . Encounter for supervision of other normal pregnancy in first trimester 04/01/2015  . Allergic rhinitis 04/30/2013  . At high risk for complications of intrauterine pregnancy (IUP) 09/05/2012  . Fibrocystic breast changes 11/23/2011  . Routine general medical examination at a health care facility 10/04/2011  . Fatigue 11/29/2010  . DELAYED MENSES 09/14/2008  . FOLLICULITIS 04/29/2008  . BACK PAIN, LUMBAR 04/14/2008   Objective:    BP 104/65 mmHg  Pulse 78  Wt 160 lb (72.576 kg)  LMP 01/03/2015 FHT: 135 BPM  Uterine Size: size equals dates and at U.     Assessment:    Pregnancy @ 2046w5d    Fatigue  Hematuria  Plan:    OBGCT: discussed. Signs and symptoms of preterm labor: discussed.  Labs, problem list reviewed and updated 2 hr GTT planned Follow up in 4 weeks.

## 2015-05-21 NOTE — Progress Notes (Signed)
Pt is doing well.

## 2015-05-22 LAB — CULTURE, OB URINE

## 2015-06-18 ENCOUNTER — Ambulatory Visit (INDEPENDENT_AMBULATORY_CARE_PROVIDER_SITE_OTHER): Payer: Medicaid Other | Admitting: Certified Nurse Midwife

## 2015-06-18 VITALS — BP 106/64 | HR 59 | Temp 98.6°F | Wt 170.0 lb

## 2015-06-18 DIAGNOSIS — Z3492 Encounter for supervision of normal pregnancy, unspecified, second trimester: Secondary | ICD-10-CM

## 2015-06-18 LAB — POCT URINALYSIS DIPSTICK
Bilirubin, UA: NEGATIVE
GLUCOSE UA: NEGATIVE
Ketones, UA: NEGATIVE
LEUKOCYTES UA: NEGATIVE
NITRITE UA: NEGATIVE
PROTEIN UA: NEGATIVE
RBC UA: 50
UROBILINOGEN UA: NEGATIVE
pH, UA: 7

## 2015-06-18 NOTE — Addendum Note (Signed)
Addended by: Marya Landry D on: 06/18/2015 03:00 PM   Modules accepted: Orders

## 2015-06-18 NOTE — Progress Notes (Signed)
Subjective:    Jody Bullock is a 35 y.o. female being seen today for her obstetrical visit. She is at [redacted]w[redacted]d gestation. Patient reports: backache, no bleeding, no contractions, no cramping, no leaking and round ligament pain with movement, states that her anus has some itching, denies any bleeding . Fetal movement: normal.  Problem List Items Addressed This Visit    None    Visit Diagnoses    Supervision of normal pregnancy, second trimester    -  Primary    Relevant Orders    POCT urinalysis dipstick (Completed)      Patient Active Problem List   Diagnosis Date Noted  . Encounter for supervision of other normal pregnancy in first trimester 04/01/2015  . Allergic rhinitis 04/30/2013  . At high risk for complications of intrauterine pregnancy (IUP) 09/05/2012  . Fibrocystic breast changes 11/23/2011  . Routine general medical examination at a health care facility 10/04/2011  . Fatigue 11/29/2010  . DELAYED MENSES 09/14/2008  . FOLLICULITIS 04/29/2008  . BACK PAIN, LUMBAR 04/14/2008   Objective:    BP 106/64 mmHg  Pulse 59  Temp(Src) 98.6 F (37 C)  Wt 170 lb (77.111 kg)  LMP 01/03/2015 FHT: 150 BPM  Uterine Size: size equals dates     Assessment:    Pregnancy @ [redacted]w[redacted]d    round ligament/lumbar back pain  Hemorrhoids Plan:   Sample of Anusol given to patient  Rx for maternity support belt   OBGCT: discussed and ordered for next visit. Signs and symptoms of preterm labor: discussed.  Labs, problem list reviewed and updated 2 hr GTT planned Follow up in 4 weeks with OGTT.

## 2015-06-18 NOTE — Progress Notes (Signed)
Patient is having some ligament pain- she also has questions about hemorrhoids

## 2015-06-22 ENCOUNTER — Other Ambulatory Visit: Payer: Self-pay | Admitting: Certified Nurse Midwife

## 2015-06-22 DIAGNOSIS — O2342 Unspecified infection of urinary tract in pregnancy, second trimester: Secondary | ICD-10-CM

## 2015-06-22 LAB — CULTURE, OB URINE

## 2015-06-22 MED ORDER — NITROFURANTOIN MONOHYD MACRO 100 MG PO CAPS: 100.0000 mg | ORAL_CAPSULE | Freq: Two times a day (BID) | ORAL | Status: AC

## 2015-07-16 ENCOUNTER — Ambulatory Visit (INDEPENDENT_AMBULATORY_CARE_PROVIDER_SITE_OTHER): Payer: Medicaid Other | Admitting: Certified Nurse Midwife

## 2015-07-16 ENCOUNTER — Other Ambulatory Visit: Payer: Medicaid Other

## 2015-07-16 VITALS — BP 94/63 | HR 65 | Temp 98.2°F | Wt 175.0 lb

## 2015-07-16 DIAGNOSIS — Z3482 Encounter for supervision of other normal pregnancy, second trimester: Secondary | ICD-10-CM

## 2015-07-16 LAB — POCT URINALYSIS DIPSTICK
Bilirubin, UA: NEGATIVE
Blood, UA: NEGATIVE
Glucose, UA: NEGATIVE
KETONES UA: NEGATIVE
LEUKOCYTES UA: NEGATIVE
NITRITE UA: NEGATIVE
PH UA: 8
Spec Grav, UA: 1.01
UROBILINOGEN UA: NEGATIVE

## 2015-07-16 LAB — CBC
HCT: 29.1 % — ABNORMAL LOW (ref 36.0–46.0)
Hemoglobin: 9.8 g/dL — ABNORMAL LOW (ref 12.0–15.0)
MCH: 31.3 pg (ref 26.0–34.0)
MCHC: 33.7 g/dL (ref 30.0–36.0)
MCV: 93 fL (ref 78.0–100.0)
MPV: 10.7 fL (ref 8.6–12.4)
PLATELETS: 155 10*3/uL (ref 150–400)
RBC: 3.13 MIL/uL — AB (ref 3.87–5.11)
RDW: 13.2 % (ref 11.5–15.5)
WBC: 6.4 10*3/uL (ref 4.0–10.5)

## 2015-07-16 NOTE — Progress Notes (Signed)
Subjective:    Jody Bullock is a 35 y.o. female being seen today for her obstetrical visit. She is at 2760w5d gestation. Patient reports: no complaints . Fetal movement: normal.  Patient vomited after 1 hour was completed, cap glucose was 101, HgbA1c was added to the order.    Problem List Items Addressed This Visit    None    Visit Diagnoses    Supervision of other normal pregnancy, antepartum, second trimester    -  Primary    Relevant Orders    Glucose Tolerance, 2 Hours w/1 Hour    CBC    HIV antibody    RPR    POCT urinalysis dipstick (Completed)      Patient Active Problem List   Diagnosis Date Noted  . Encounter for supervision of other normal pregnancy in first trimester 04/01/2015  . Allergic rhinitis 04/30/2013  . At high risk for complications of intrauterine pregnancy (IUP) 09/05/2012  . Fibrocystic breast changes 11/23/2011  . Routine general medical examination at a health care facility 10/04/2011  . Fatigue 11/29/2010  . DELAYED MENSES 09/14/2008  . FOLLICULITIS 04/29/2008  . BACK PAIN, LUMBAR 04/14/2008   Objective:    BP 94/63 mmHg  Pulse 65  Temp(Src) 98.2 F (36.8 C)  Wt 175 lb (79.379 kg)  LMP 01/03/2015 FHT: 140 BPM  Uterine Size: size equals dates     Assessment:    Pregnancy @ 6160w5d    Doing well  Plan:    OBGCT: ordered. Signs and symptoms of preterm labor: discussed.  Labs, problem list reviewed and updated 2 hr GTT today Follow up in 2 weeks.

## 2015-07-17 LAB — RPR

## 2015-07-17 LAB — GLUCOSE TOLERANCE, 2 HOURS W/ 1HR
GLUCOSE, 2 HOUR: 56 mg/dL — AB (ref <140)
GLUCOSE: 71 mg/dL
Glucose, Fasting: 67 mg/dL (ref 65–99)

## 2015-07-17 LAB — HIV ANTIBODY (ROUTINE TESTING W REFLEX): HIV: NONREACTIVE

## 2015-07-21 ENCOUNTER — Other Ambulatory Visit: Payer: Self-pay | Admitting: Certified Nurse Midwife

## 2015-07-21 MED ORDER — VITAFOL FE+ 90-1-200 & 50 MG PO CPPK: 2.0000 | ORAL_CAPSULE | Freq: Every day | ORAL | Status: DC

## 2015-07-30 ENCOUNTER — Ambulatory Visit (INDEPENDENT_AMBULATORY_CARE_PROVIDER_SITE_OTHER): Payer: Medicaid Other | Admitting: Certified Nurse Midwife

## 2015-07-30 VITALS — BP 124/71 | HR 57 | Wt 175.0 lb

## 2015-07-30 DIAGNOSIS — Z3483 Encounter for supervision of other normal pregnancy, third trimester: Secondary | ICD-10-CM

## 2015-07-30 LAB — POCT URINALYSIS DIPSTICK
Bilirubin, UA: NEGATIVE
Blood, UA: NEGATIVE
Glucose, UA: NEGATIVE
KETONES UA: NEGATIVE
LEUKOCYTES UA: NEGATIVE
Nitrite, UA: NEGATIVE
PH UA: 7
SPEC GRAV UA: 1.01
UROBILINOGEN UA: NEGATIVE

## 2015-07-30 NOTE — Progress Notes (Signed)
Subjective:    Monia PouchWallis L Sambrano is a 35 y.o. female being seen today for her obstetrical visit. She is at 7522w5d gestation. Patient reports no complaints. Fetal movement: normal.  Problem List Items Addressed This Visit    None    Visit Diagnoses    Encounter for supervision of other normal pregnancy in third trimester    -  Primary    Relevant Orders    POCT urinalysis dipstick (Completed)      Patient Active Problem List   Diagnosis Date Noted  . Encounter for supervision of other normal pregnancy in first trimester 04/01/2015  . Allergic rhinitis 04/30/2013  . At high risk for complications of intrauterine pregnancy (IUP) 09/05/2012  . Fibrocystic breast changes 11/23/2011  . Routine general medical examination at a health care facility 10/04/2011  . Fatigue 11/29/2010  . DELAYED MENSES 09/14/2008  . FOLLICULITIS 04/29/2008  . BACK PAIN, LUMBAR 04/14/2008   Objective:    BP 124/71 mmHg  Pulse 57  Wt 175 lb (79.379 kg)  LMP 01/03/2015 FHT:  145 BPM  Uterine Size: size equals dates  Presentation: cephalic     Assessment:    Pregnancy @ 5922w5d weeks   Plan:     labs reviewed, problem list updated Consent signed. GBS sent TDAP offered  Rhogam given for RH negative Pediatrician: discussed. Infant feeding: plans to breastfeed. Maternity leave: discussed. Cigarette smoking: never smoked. Orders Placed This Encounter  Procedures  . POCT urinalysis dipstick   No orders of the defined types were placed in this encounter.   Follow up in 2 Weeks with Dr. Clearance CootsHarper to meet him.

## 2015-08-13 ENCOUNTER — Ambulatory Visit (INDEPENDENT_AMBULATORY_CARE_PROVIDER_SITE_OTHER): Payer: Medicaid Other | Admitting: Certified Nurse Midwife

## 2015-08-13 VITALS — BP 112/73 | HR 61 | Wt 178.0 lb

## 2015-08-13 DIAGNOSIS — Z3483 Encounter for supervision of other normal pregnancy, third trimester: Secondary | ICD-10-CM

## 2015-08-13 NOTE — Progress Notes (Signed)
Pt is having some cramping, occasional.

## 2015-08-13 NOTE — Progress Notes (Signed)
Subjective:    Jody Bullock is a 35 y.o. female being seen today for her obstetrical visit. She is at 6723w5d gestation. Patient reports no complaints. Is having some round ligament pain with change in position.  Fetal movement: normal.  Problem List Items Addressed This Visit    None    Visit Diagnoses    Encounter for supervision of other normal pregnancy in third trimester    -  Primary    Relevant Orders    POCT urinalysis dipstick      Patient Active Problem List   Diagnosis Date Noted  . Encounter for supervision of other normal pregnancy in first trimester 04/01/2015  . Allergic rhinitis 04/30/2013  . At high risk for complications of intrauterine pregnancy (IUP) 09/05/2012  . Fibrocystic breast changes 11/23/2011  . Routine general medical examination at a health care facility 10/04/2011  . Fatigue 11/29/2010  . DELAYED MENSES 09/14/2008  . FOLLICULITIS 04/29/2008  . BACK PAIN, LUMBAR 04/14/2008   Objective:    BP 112/73 mmHg  Pulse 61  Wt 178 lb (80.74 kg)  LMP 01/03/2015 FHT:  150 BPM  Uterine Size: size equals dates  Presentation: cephalic     Assessment:    Pregnancy @ 9823w5d weeks   Doing well  Plan:    Plans circumcising of female infant postpartum   labs reviewed, problem list updated Consent signed. GBS planning TDAP offered  Rhogam given for RH negative Pediatrician: discussed. Infant feeding: plans to breastfeed. Maternity leave: discussed. Cigarette smoking: never smoked. Orders Placed This Encounter  Procedures  . POCT urinalysis dipstick   No orders of the defined types were placed in this encounter.   Follow up in 2 Weeks.

## 2015-08-26 ENCOUNTER — Ambulatory Visit (INDEPENDENT_AMBULATORY_CARE_PROVIDER_SITE_OTHER): Payer: Medicaid Other | Admitting: Obstetrics

## 2015-08-26 VITALS — BP 101/64 | HR 83 | Wt 177.0 lb

## 2015-08-26 DIAGNOSIS — Z3483 Encounter for supervision of other normal pregnancy, third trimester: Secondary | ICD-10-CM

## 2015-08-26 LAB — POCT URINALYSIS DIPSTICK
Bilirubin, UA: NEGATIVE
GLUCOSE UA: NEGATIVE
Ketones, UA: NEGATIVE
NITRITE UA: NEGATIVE
PH UA: 7
Protein, UA: NEGATIVE
RBC UA: 50
UROBILINOGEN UA: NEGATIVE

## 2015-08-26 NOTE — Progress Notes (Signed)
Pt states that she has been having some ctx and cramping, mostly at night.

## 2015-08-27 ENCOUNTER — Encounter: Payer: Self-pay | Admitting: Obstetrics

## 2015-08-27 NOTE — Progress Notes (Signed)
Subjective:    Monia PouchWallis L Bumgarner is a 35 y.o. female being seen today for her obstetrical visit. She is at 6673w5d gestation. Patient reports no complaints. Fetal movement: normal.  Problem List Items Addressed This Visit    None    Visit Diagnoses    Encounter for supervision of other normal pregnancy in third trimester    -  Primary    Relevant Orders    POCT urinalysis dipstick (Completed)    Culture, OB Urine      Patient Active Problem List   Diagnosis Date Noted  . Encounter for supervision of other normal pregnancy in first trimester 04/01/2015  . Allergic rhinitis 04/30/2013  . At high risk for complications of intrauterine pregnancy (IUP) 09/05/2012  . Fibrocystic breast changes 11/23/2011  . Routine general medical examination at a health care facility 10/04/2011  . Fatigue 11/29/2010  . DELAYED MENSES 09/14/2008  . FOLLICULITIS 04/29/2008  . BACK PAIN, LUMBAR 04/14/2008   Objective:    BP 101/64 mmHg  Pulse 83  Wt 177 lb (80.287 kg)  LMP 01/03/2015 FHT:  150 BPM  Uterine Size: size equals dates  Presentation: unsure     Assessment:    Pregnancy @ 4373w5d weeks   Plan:     labs reviewed, problem list updated Consent signed. GBS sent TDAP offered  Rhogam given for RH negative Pediatrician: discussed. Infant feeding: plans to breastfeed. Maternity leave: discussed. Cigarette smoking: never smoked. Orders Placed This Encounter  Procedures  . Culture, OB Urine  . POCT urinalysis dipstick   No orders of the defined types were placed in this encounter.   Follow up in 2 Weeks.

## 2015-08-28 LAB — URINE CULTURE, OB REFLEX

## 2015-08-28 LAB — CULTURE, OB URINE

## 2015-09-08 ENCOUNTER — Inpatient Hospital Stay (HOSPITAL_COMMUNITY): Payer: Medicaid Other | Admitting: Anesthesiology

## 2015-09-08 ENCOUNTER — Ambulatory Visit (INDEPENDENT_AMBULATORY_CARE_PROVIDER_SITE_OTHER): Payer: Medicaid Other | Admitting: Certified Nurse Midwife

## 2015-09-08 ENCOUNTER — Inpatient Hospital Stay (HOSPITAL_COMMUNITY)
Admission: AD | Admit: 2015-09-08 | Discharge: 2015-09-10 | DRG: 775 | Disposition: A | Discharge status: Home / Self Care | Payer: Medicaid Other | Source: Ambulatory Visit | Attending: Obstetrics | Admitting: Obstetrics

## 2015-09-08 ENCOUNTER — Inpatient Hospital Stay (HOSPITAL_COMMUNITY): Payer: Medicaid Other

## 2015-09-08 ENCOUNTER — Encounter (HOSPITAL_COMMUNITY): Payer: Self-pay

## 2015-09-08 VITALS — BP 110/70 | HR 67 | Wt 181.0 lb

## 2015-09-08 DIAGNOSIS — E669 Obesity, unspecified: Secondary | ICD-10-CM | POA: Diagnosis present

## 2015-09-08 DIAGNOSIS — Z3A35 35 weeks gestation of pregnancy: Secondary | ICD-10-CM | POA: Diagnosis not present

## 2015-09-08 DIAGNOSIS — D649 Anemia, unspecified: Secondary | ICD-10-CM | POA: Diagnosis present

## 2015-09-08 DIAGNOSIS — O99324 Drug use complicating childbirth: Secondary | ICD-10-CM | POA: Diagnosis present

## 2015-09-08 DIAGNOSIS — O42113 Preterm premature rupture of membranes, onset of labor more than 24 hours following rupture, third trimester: Secondary | ICD-10-CM | POA: Diagnosis present

## 2015-09-08 DIAGNOSIS — O99214 Obesity complicating childbirth: Secondary | ICD-10-CM | POA: Diagnosis present

## 2015-09-08 DIAGNOSIS — O42913 Preterm premature rupture of membranes, unspecified as to length of time between rupture and onset of labor, third trimester: Secondary | ICD-10-CM | POA: Diagnosis present

## 2015-09-08 DIAGNOSIS — F1210 Cannabis abuse, uncomplicated: Secondary | ICD-10-CM | POA: Diagnosis present

## 2015-09-08 DIAGNOSIS — O9081 Anemia of the puerperium: Secondary | ICD-10-CM | POA: Diagnosis present

## 2015-09-08 DIAGNOSIS — Z3483 Encounter for supervision of other normal pregnancy, third trimester: Secondary | ICD-10-CM

## 2015-09-08 DIAGNOSIS — O329XX0 Maternal care for malpresentation of fetus, unspecified, not applicable or unspecified: Secondary | ICD-10-CM

## 2015-09-08 DIAGNOSIS — Z6833 Body mass index (BMI) 33.0-33.9, adult: Secondary | ICD-10-CM

## 2015-09-08 DIAGNOSIS — O42919 Preterm premature rupture of membranes, unspecified as to length of time between rupture and onset of labor, unspecified trimester: Secondary | ICD-10-CM

## 2015-09-08 HISTORY — DX: Preterm premature rupture of membranes, unspecified as to length of time between rupture and onset of labor, unspecified trimester: O42.919

## 2015-09-08 LAB — URINE MICROSCOPIC-ADD ON
BACTERIA UA: NONE SEEN
WBC UA: NONE SEEN WBC/hpf (ref 0–5)

## 2015-09-08 LAB — COMPREHENSIVE METABOLIC PANEL
ALBUMIN: 3 g/dL — AB (ref 3.5–5.0)
ALK PHOS: 136 U/L — AB (ref 38–126)
ALT: 7 U/L — AB (ref 14–54)
ANION GAP: 5 (ref 5–15)
AST: 21 U/L (ref 15–41)
BILIRUBIN TOTAL: 0.3 mg/dL (ref 0.3–1.2)
BUN: 9 mg/dL (ref 6–20)
CALCIUM: 9 mg/dL (ref 8.9–10.3)
CO2: 24 mmol/L (ref 22–32)
Chloride: 105 mmol/L (ref 101–111)
Creatinine, Ser: 0.62 mg/dL (ref 0.44–1.00)
GFR calc Af Amer: >60 mL/min (ref >60)
GFR calc non Af Amer: >60 mL/min (ref >60)
GLUCOSE: 86 mg/dL (ref 65–99)
Potassium: 4.5 mmol/L (ref 3.5–5.1)
SODIUM: 134 mmol/L — AB (ref 135–145)
TOTAL PROTEIN: 6.3 g/dL — AB (ref 6.5–8.1)

## 2015-09-08 LAB — POCT URINALYSIS DIPSTICK
BILIRUBIN UA: NEGATIVE
GLUCOSE UA: NEGATIVE
Ketones, UA: NEGATIVE
LEUKOCYTES UA: NEGATIVE
NITRITE UA: NEGATIVE
PH UA: 8
Protein, UA: NEGATIVE
Spec Grav, UA: <=1.005
Urobilinogen, UA: NEGATIVE

## 2015-09-08 LAB — RAPID URINE DRUG SCREEN, HOSP PERFORMED
AMPHETAMINES: NOT DETECTED
BARBITURATES: NOT DETECTED
BENZODIAZEPINES: NOT DETECTED
Cocaine: NOT DETECTED
Opiates: NOT DETECTED
Tetrahydrocannabinol: POSITIVE — AB

## 2015-09-08 LAB — URINALYSIS, ROUTINE W REFLEX MICROSCOPIC
Bilirubin Urine: NEGATIVE
Glucose, UA: NEGATIVE mg/dL
KETONES UR: NEGATIVE mg/dL
LEUKOCYTES UA: NEGATIVE
Nitrite: NEGATIVE
PH: 6 (ref 5.0–8.0)
Protein, ur: NEGATIVE mg/dL
Specific Gravity, Urine: <1.005 — ABNORMAL LOW (ref 1.005–1.030)

## 2015-09-08 LAB — CBC
HEMATOCRIT: 26.5 % — AB (ref 36.0–46.0)
HEMOGLOBIN: 8.6 g/dL — AB (ref 12.0–15.0)
MCH: 28.5 pg (ref 26.0–34.0)
MCHC: 32.5 g/dL (ref 30.0–36.0)
MCV: 87.7 fL (ref 78.0–100.0)
Platelets: 149 10*3/uL — ABNORMAL LOW (ref 150–400)
RBC: 3.02 MIL/uL — AB (ref 3.87–5.11)
RDW: 13.3 % (ref 11.5–15.5)
WBC: 7.5 10*3/uL (ref 4.0–10.5)

## 2015-09-08 MED ORDER — ZOLPIDEM TARTRATE 5 MG PO TABS: 5.0000 mg | ORAL_TABLET | Freq: Every day | ORAL | Status: DC

## 2015-09-08 MED ORDER — FENTANYL 2.5 MCG/ML BUPIVACAINE 1/10 % EPIDURAL INFUSION (WH - ANES)
INTRAMUSCULAR | Status: AC
  Administered 2015-09-09: 14 mL/h
  Filled 2015-09-08: qty 125

## 2015-09-08 MED ORDER — LACTATED RINGERS IV SOLN
INTRAVENOUS | Status: DC
  Administered 2015-09-08: 13:00:00 via INTRAVENOUS

## 2015-09-08 MED ORDER — CITRIC ACID-SODIUM CITRATE 334-500 MG/5ML PO SOLN
30.0000 mL | ORAL | Status: DC | PRN
  Administered 2015-09-08: 30 mL via ORAL
  Filled 2015-09-08: qty 15

## 2015-09-08 MED ORDER — OXYTOCIN BOLUS FROM INFUSION
500.0000 mL | INTRAVENOUS | Status: DC
  Administered 2015-09-09: 500 mL via INTRAVENOUS

## 2015-09-08 MED ORDER — LIDOCAINE HCL (PF) 1 % IJ SOLN
30.0000 mL | INTRAMUSCULAR | Status: DC | PRN
  Filled 2015-09-08: qty 30

## 2015-09-08 MED ORDER — TERBUTALINE SULFATE 1 MG/ML IJ SOLN
0.2500 mg | Freq: Once | INTRAMUSCULAR | Status: DC | PRN
  Filled 2015-09-08: qty 1

## 2015-09-08 MED ORDER — OXYTOCIN 10 UNIT/ML IJ SOLN
2.5000 [IU]/h | INTRAVENOUS | Status: DC
  Filled 2015-09-08: qty 4

## 2015-09-08 MED ORDER — BETAMETHASONE SOD PHOS & ACET 6 (3-3) MG/ML IJ SUSP
12.0000 mg | Freq: Once | INTRAMUSCULAR | Status: AC
  Administered 2015-09-08: 12 mg via INTRAMUSCULAR
  Filled 2015-09-08: qty 2

## 2015-09-08 MED ORDER — FENTANYL CITRATE (PF) 100 MCG/2ML IJ SOLN
100.0000 ug | INTRAMUSCULAR | Status: DC | PRN
  Administered 2015-09-08: 100 ug via INTRAVENOUS
  Filled 2015-09-08: qty 2

## 2015-09-08 MED ORDER — ACETAMINOPHEN 325 MG PO TABS: 650.0000 mg | ORAL_TABLET | ORAL | Status: DC | PRN

## 2015-09-08 MED ORDER — OXYCODONE-ACETAMINOPHEN 5-325 MG PO TABS: 2.0000 | ORAL_TABLET | ORAL | Status: DC | PRN

## 2015-09-08 MED ORDER — OXYTOCIN 10 UNIT/ML IJ SOLN
1.0000 m[IU]/min | INTRAMUSCULAR | Status: DC
  Administered 2015-09-08: 2 m[IU]/min via INTRAVENOUS

## 2015-09-08 MED ORDER — OXYTOCIN 10 UNIT/ML IJ SOLN: 10.0000 [IU] | Freq: Once | INTRAMUSCULAR | Status: DC

## 2015-09-08 MED ORDER — PENICILLIN G POTASSIUM 5000000 UNITS IJ SOLR
5.0000 10*6.[IU] | Freq: Once | INTRAMUSCULAR | Status: AC
  Administered 2015-09-08: 5 10*6.[IU] via INTRAVENOUS
  Filled 2015-09-08: qty 5

## 2015-09-08 MED ORDER — PENICILLIN G POTASSIUM 5000000 UNITS IJ SOLR
2.5000 10*6.[IU] | INTRAVENOUS | Status: DC
  Administered 2015-09-08 – 2015-09-09 (×3): 2.5 10*6.[IU] via INTRAVENOUS
  Filled 2015-09-08 (×6): qty 2.5

## 2015-09-08 MED ORDER — OXYCODONE-ACETAMINOPHEN 5-325 MG PO TABS: 1.0000 | ORAL_TABLET | ORAL | Status: DC | PRN

## 2015-09-08 MED ORDER — PHENYLEPHRINE 40 MCG/ML (10ML) SYRINGE FOR IV PUSH (FOR BLOOD PRESSURE SUPPORT)
PREFILLED_SYRINGE | INTRAVENOUS | Status: AC
  Filled 2015-09-08: qty 20

## 2015-09-08 MED ORDER — ONDANSETRON HCL 4 MG/2ML IJ SOLN
4.0000 mg | Freq: Four times a day (QID) | INTRAMUSCULAR | Status: DC | PRN
  Administered 2015-09-09: 4 mg via INTRAVENOUS
  Filled 2015-09-08: qty 2

## 2015-09-08 MED ORDER — FLEET ENEMA 7-19 GM/118ML RE ENEM: 1.0000 | ENEMA | Freq: Every day | RECTAL | Status: DC | PRN

## 2015-09-08 MED ORDER — BETAMETHASONE SOD PHOS & ACET 6 (3-3) MG/ML IJ SUSP
12.0000 mg | Freq: Once | INTRAMUSCULAR | Status: DC
  Filled 2015-09-08: qty 2

## 2015-09-08 MED ORDER — LACTATED RINGERS IV SOLN: 500.0000 mL | INTRAVENOUS | Status: DC | PRN

## 2015-09-08 NOTE — H&P (Signed)
Jody Bullock is a 35 y.o. female presenting for PPROM at 3331w3d. Maternal Medical History:  Reason for admission: Rupture of membranes.     OB History    Gravida Para Term Preterm AB TAB SAB Ectopic Multiple Living   3 1 1  0 1 0 1 0 0 1     Past Medical History  Diagnosis Date  . Medical history non-contributory    Past Surgical History  Procedure Laterality Date  . No past surgeries     Family History: family history includes Cancer in her father; Gallbladder disease in her mother. Social History:  reports that she has never smoked. She does not have any smokeless tobacco history on file. She reports that she uses illicit drugs (Marijuana). She reports that she does not drink alcohol.   Prenatal Transfer Tool  Maternal Diabetes: No Genetic Screening: Normal Maternal Ultrasounds/Referrals: Normal Fetal Ultrasounds or other Referrals:  None Maternal Substance Abuse:  Yes:  Type: Marijuana Significant Maternal Medications:  None Significant Maternal Lab Results:  None Other Comments:  None  ROS  Dilation: 3 Effacement (%): 50 Station: Ballotable Exam by:: Apple ComputerChristina Woods RN Blood pressure 114/70, pulse 85, temperature 98.2 F (36.8 C), temperature source Oral, resp. rate 18, height 5' 1.5" (1.562 m), weight 181 lb (82.101 kg), last menstrual period 01/03/2015, SpO2 100 %, unknown if currently breastfeeding. Exam Physical Exam  Prenatal labs: ABO, Rh: O/POS/-- (11/17 1425) Antibody: NEG (11/17 1425) Rubella: 6.80 (11/17 1425) RPR: NON REAC (03/03 1117)  HBsAg: NEGATIVE (11/17 1425)  HIV: NONREACTIVE (03/03 1117)  GBS:     Assessment/Plan: Admit for PPROM.  Start Pitocin.   Roe CoombsRachelle A Denney, CNM 09/08/2015, 6:29 PM

## 2015-09-08 NOTE — Anesthesia Pain Management Evaluation Note (Signed)
  CRNA Pain Management Visit Note  Patient: Jody Bullock, 35 y.o., female  "Hello I am a member of the anesthesia team at [facility]. We have an anesthesia team available at all times to provide care throughout the hospital, including epidural management and anesthesia for C-section. I don't know your plan for the delivery whether it a natural birth, water birth, IV sedation, nitrous supplementation, doula or epidural, but we want to meet your pain goals."   1.Was your pain managed to your expectations on prior hospitalizations?  2.What is your expectation for pain management during this hospitalization?     Epidural  3.How can we help you reach that goal? Provide epidural as soon as I can have it.   Record the patient's initial score and the patient's pain goal.   Pain: 3  Pain Goal: 7  [facility] wants you to be able to say your pain was always managed very well.  Vi Biddinger 09/08/2015

## 2015-09-08 NOTE — Progress Notes (Signed)
Subjective:    Jody Bullock is a 35 y.o. female being seen today for her obstetrical visit. She is at 2648w3d gestation. Patient reports backache, no bleeding, occasional contractions and reports leaking for two days. Fetal movement: normal.  Problem List Items Addressed This Visit    None     Patient Active Problem List   Diagnosis Date Noted  . Encounter for supervision of other normal pregnancy in first trimester 04/01/2015  . Allergic rhinitis 04/30/2013  . At high risk for complications of intrauterine pregnancy (IUP) 09/05/2012  . Fibrocystic breast changes 11/23/2011  . Routine general medical examination at a health care facility 10/04/2011  . Fatigue 11/29/2010  . DELAYED MENSES 09/14/2008  . FOLLICULITIS 04/29/2008  . BACK PAIN, LUMBAR 04/14/2008   Objective:    LMP 01/03/2015 FHT:  145 BPM  Uterine Size: size equals dates  Presentation: cephalic     Assessment:    Pregnancy @ 3848w3d weeks   PPROM: +fern, +nitrazine, + pooling  Plan:    Direct admission to L&D   labs reviewed, problem list updated Consent signed. GBS sent TDAP offered  Rhogam given for RH negative Pediatrician: discussed. Infant feeding: plans to breastfeed. Maternity leave: discussed. Cigarette smoking: never smoked. No orders of the defined types were placed in this encounter.   No orders of the defined types were placed in this encounter.   Follow up postpartum.

## 2015-09-08 NOTE — Progress Notes (Signed)
Pt states that she has been feeling "wet" for the past couple of days.

## 2015-09-08 NOTE — Patient Instructions (Signed)
Group B streptococcus (GBS) is a type of bacteria often found in healthy women. GBS is not the same as the bacteria that causes strep throat. You may have GBS in your vagina, rectum, or bladder. GBS does not spread through sexual contact, but it can be passed to a baby during childbirth. This can be dangerous for your baby. It is not dangerous to you and usually does not cause any symptoms. Your health care provider may test you for GBS when your pregnancy is between 35 and 37 weeks. GBS is dangerous only during birth, so there is no need to test for it earlier. It is possible to have GBS during pregnancy and never pass it to your baby. If your test results are positive for GBS, your health care provider may recommend giving you antibiotic medicine during delivery to make sure your baby stays healthy. RISK FACTORS You are more likely to pass GBS to your baby if:   Your water breaks (ruptured membrane) or you go into labor before 37 weeks.  Your water breaks 18 hours before you deliver.  You passed GBS during a previous pregnancy.  You have a urinary tract infection caused by GBS any time during pregnancy.  You have a fever during labor. SYMPTOMS Most women who have GBS do not have any symptoms. If you have a urinary tract infection caused by GBS, you might have frequent or painful urination and fever. Babies who get GBS usually show symptoms within 7 days of birth. Symptoms may include:   Breathing problems.  Heart and blood pressure problems.  Digestive and kidney problems. DIAGNOSIS Routine screening for GBS is recommended for all pregnant women. A health care provider takes a sample of the fluid in your vagina and rectum with a swab. It is then sent to a lab to be checked for GBS. A sample of your urine may also be checked for the bacteria.  TREATMENT If you test positive for GBS, you may need treatment with an antibiotic medicine during labor. As soon as you go into labor, or as soon as  your membranes rupture, you will get the antibiotic medicine through an IV access. You will continue to get the medicine until after you give birth. You do not need antibiotic medicine if you are having a cesarean delivery.If your baby shows signs or symptoms of GBS after birth, your baby can also be treated with an antibiotic medicine. HOME CARE INSTRUCTIONS   Take all antibiotic medicine as prescribed by your health care provider. Only take medicine as directed.   Continue with prenatal visits and care.   Keep all follow-up appointments.  SEEK MEDICAL CARE IF:   You have pain when you urinate.   You have to urinate frequently.   You have a fever.  SEEK IMMEDIATE MEDICAL CARE IF:   Your membranes rupture.  You go into labor.   This information is not intended to replace advice given to you by your health care provider. Make sure you discuss any questions you have with your health care provider.   Document Released: 08/08/2007 Document Revised: 05/06/2013 Document Reviewed: 02/21/2013 Elsevier Interactive Patient Education 2016 [employer]  

## 2015-09-08 NOTE — H&P (Signed)
Jody Bullock L Fawley is a 35 y.o. female presenting for PROM. Maternal Medical History:  Reason for admission: Rupture of membranes.   Fetal activity: Perceived fetal activity is normal.   Last perceived fetal movement was within the past hour.    Prenatal complications: no prenatal complications Prenatal Complications - Diabetes: none.    OB History    Gravida Para Term Preterm AB TAB SAB Ectopic Multiple Living   3 1 1  0 1 0 1 0 0 1     Past Medical History  Diagnosis Date  . Medical history non-contributory    Past Surgical History  Procedure Laterality Date  . No past surgeries     Family History: family history includes Cancer in her father; Gallbladder disease in her mother. Social History:  reports that she has never smoked. She does not have any smokeless tobacco history on file. She reports that she uses illicit drugs (Marijuana). She reports that she does not drink alcohol.   Prenatal Transfer Tool  Maternal Diabetes: No Genetic Screening: Normal Maternal Ultrasounds/Referrals: Normal Fetal Ultrasounds or other Referrals:  None Maternal Substance Abuse:  No Significant Maternal Medications:  None Significant Maternal Lab Results:  None Other Comments:  None  Review of Systems  All other systems reviewed and are negative.   Dilation: 3 Effacement (%): 50 Station: Ballotable Exam by:: Apple ComputerChristina Woods RN Blood pressure 108/63, pulse 59, temperature 98.4 F (36.9 C), temperature source Oral, resp. rate 18, height 5' 1.5" (1.562 m), weight 181 lb (82.101 kg), last menstrual period 01/03/2015, unknown if currently breastfeeding. Maternal Exam:  Abdomen: Patient reports no abdominal tenderness. Fetal presentation: vertex  Introitus: Normal vulva. Normal vagina.    Physical Exam  Nursing note and vitals reviewed. Constitutional: She is oriented to person, place, and time. She appears well-developed and well-nourished.  HENT:  Head: Normocephalic and atraumatic.   Eyes: Conjunctivae are normal. Pupils are equal, round, and reactive to light.  Neck: Normal range of motion. Neck supple.  Cardiovascular: Normal rate and regular rhythm.   Respiratory: Effort normal and breath sounds normal.  GI: Soft. Bowel sounds are normal.  Genitourinary: Vagina normal and uterus normal.  Musculoskeletal: Normal range of motion.  Neurological: She is alert and oriented to person, place, and time.  Skin: Skin is warm and dry.  Psychiatric: She has a normal mood and affect. Her behavior is normal. Judgment and thought content normal.    Prenatal labs: ABO, Rh: O/POS/-- (11/17 1425) Antibody: NEG (11/17 1425) Rubella: 6.80 (11/17 1425) RPR: NON REAC (03/03 1117)  HBsAg: NEGATIVE (11/17 1425)  HIV: NONREACTIVE (03/03 1117)  GBS:     Assessment/Plan: 35.3 weeks.  PROM.  Admit.  Pitocin IOL.  PCN prophylaxis for GBS.   HARPER,CHARLES A 09/08/2015, 4:55 PM

## 2015-09-09 ENCOUNTER — Encounter (HOSPITAL_COMMUNITY): Payer: Self-pay | Admitting: Emergency Medicine

## 2015-09-09 LAB — RPR: RPR Ser Ql: NONREACTIVE

## 2015-09-09 MED ORDER — EPHEDRINE 5 MG/ML INJ: 10.0000 mg | INTRAVENOUS | Status: DC | PRN

## 2015-09-09 MED ORDER — WITCH HAZEL-GLYCERIN EX PADS: 1.0000 "application " | MEDICATED_PAD | CUTANEOUS | Status: DC | PRN

## 2015-09-09 MED ORDER — FERROUS SULFATE 325 (65 FE) MG PO TABS
325.0000 mg | ORAL_TABLET | Freq: Two times a day (BID) | ORAL | Status: DC
  Administered 2015-09-09 – 2015-09-10 (×2): 325 mg via ORAL
  Filled 2015-09-09 (×3): qty 1

## 2015-09-09 MED ORDER — LIDOCAINE HCL (PF) 1 % IJ SOLN
INTRAMUSCULAR | Status: DC | PRN
  Administered 2015-09-08 – 2015-09-09 (×2): 4 mL

## 2015-09-09 MED ORDER — PHENYLEPHRINE 40 MCG/ML (10ML) SYRINGE FOR IV PUSH (FOR BLOOD PRESSURE SUPPORT): 80.0000 ug | PREFILLED_SYRINGE | INTRAVENOUS | Status: DC | PRN

## 2015-09-09 MED ORDER — IBUPROFEN 600 MG PO TABS
600.0000 mg | ORAL_TABLET | Freq: Four times a day (QID) | ORAL | Status: DC
  Administered 2015-09-09 – 2015-09-10 (×4): 600 mg via ORAL
  Filled 2015-09-09 (×5): qty 1

## 2015-09-09 MED ORDER — COCONUT OIL OIL: 1.0000 "application " | TOPICAL_OIL | Status: DC | PRN

## 2015-09-09 MED ORDER — PRENATAL MULTIVITAMIN CH
1.0000 | ORAL_TABLET | Freq: Every day | ORAL | Status: DC
  Administered 2015-09-09 – 2015-09-10 (×2): 1 via ORAL
  Filled 2015-09-09 (×2): qty 1

## 2015-09-09 MED ORDER — ONDANSETRON HCL 4 MG PO TABS: 4.0000 mg | ORAL_TABLET | ORAL | Status: DC | PRN

## 2015-09-09 MED ORDER — BENZOCAINE-MENTHOL 20-0.5 % EX AERO: 1.0000 "application " | INHALATION_SPRAY | CUTANEOUS | Status: DC | PRN

## 2015-09-09 MED ORDER — LACTATED RINGERS IV SOLN: 500.0000 mL | Freq: Once | INTRAVENOUS | Status: DC

## 2015-09-09 MED ORDER — FENTANYL 2.5 MCG/ML BUPIVACAINE 1/10 % EPIDURAL INFUSION (WH - ANES): 14.0000 mL/h | INTRAMUSCULAR | Status: DC | PRN

## 2015-09-09 MED ORDER — SIMETHICONE 80 MG PO CHEW: 80.0000 mg | CHEWABLE_TABLET | ORAL | Status: DC | PRN

## 2015-09-09 MED ORDER — SENNOSIDES-DOCUSATE SODIUM 8.6-50 MG PO TABS
2.0000 | ORAL_TABLET | ORAL | Status: DC
  Administered 2015-09-09: 2 via ORAL
  Filled 2015-09-09: qty 2

## 2015-09-09 MED ORDER — OXYCODONE-ACETAMINOPHEN 5-325 MG PO TABS
2.0000 | ORAL_TABLET | ORAL | Status: DC | PRN
  Administered 2015-09-10: 1 via ORAL
  Filled 2015-09-09: qty 2

## 2015-09-09 MED ORDER — TETANUS-DIPHTH-ACELL PERTUSSIS 5-2.5-18.5 LF-MCG/0.5 IM SUSP
0.5000 mL | Freq: Once | INTRAMUSCULAR | Status: AC
  Administered 2015-09-10: 0.5 mL via INTRAMUSCULAR

## 2015-09-09 MED ORDER — DIBUCAINE 1 % RE OINT: 1.0000 "application " | TOPICAL_OINTMENT | RECTAL | Status: DC | PRN

## 2015-09-09 MED ORDER — DIPHENHYDRAMINE HCL 50 MG/ML IJ SOLN: 12.5000 mg | INTRAMUSCULAR | Status: DC | PRN

## 2015-09-09 MED ORDER — ZOLPIDEM TARTRATE 5 MG PO TABS: 5.0000 mg | ORAL_TABLET | Freq: Every evening | ORAL | Status: DC | PRN

## 2015-09-09 MED ORDER — ONDANSETRON HCL 4 MG/2ML IJ SOLN: 4.0000 mg | INTRAMUSCULAR | Status: DC | PRN

## 2015-09-09 MED ORDER — ACETAMINOPHEN 325 MG PO TABS
650.0000 mg | ORAL_TABLET | ORAL | Status: DC | PRN
  Administered 2015-09-10: 650 mg via ORAL
  Filled 2015-09-09: qty 2

## 2015-09-09 MED ORDER — DIPHENHYDRAMINE HCL 25 MG PO CAPS
25.0000 mg | ORAL_CAPSULE | Freq: Four times a day (QID) | ORAL | Status: DC | PRN
Start: 2015-09-09 — End: 2015-09-10

## 2015-09-09 NOTE — Anesthesia Procedure Notes (Signed)
Epidural Patient location during procedure: OB  Staffing Anesthesiologist: Colene Mines Performed by: anesthesiologist   Preanesthetic Checklist Completed: patient identified, site marked, surgical consent, pre-op evaluation, timeout performed, IV checked, risks and benefits discussed and monitors and equipment checked  Epidural Patient position: sitting Prep: site prepped and draped and DuraPrep Patient monitoring: continuous pulse ox and blood pressure Approach: midline Location: L3-L4 Injection technique: LOR saline  Needle:  Needle type: Tuohy  Needle gauge: 17 G Needle length: 9 cm and 9 Needle insertion depth: 8 cm Catheter type: closed end flexible Catheter size: 19 Gauge Catheter at skin depth: 12 cm Test dose: negative  Assessment Events: blood not aspirated, injection not painful, no injection resistance, negative IV test and no paresthesia  Additional Notes Patient identified. Risks/Benefits/Options discussed with patient including but not limited to bleeding, infection, nerve damage, paralysis, failed block, incomplete pain control, headache, blood pressure changes, nausea, vomiting, reactions to medication both or allergic, itching and postpartum back pain. Confirmed with bedside nurse the patient's most recent platelet count. Confirmed with patient that they are not currently taking any anticoagulation, have any bleeding history or any family history of bleeding disorders. Patient expressed understanding and wished to proceed. All questions were answered. Sterile technique was used throughout the entire procedure. Please see nursing notes for vital signs. Test dose was given through epidural catheter and negative prior to continuing to dose epidural or start infusion. Warning signs of high block given to the patient including shortness of breath, tingling/numbness in hands, complete motor block, or any concerning symptoms with instructions to call for help. Patient was  given instructions on fall risk and not to get out of bed. All questions and concerns addressed with instructions to call with any issues or inadequate analgesia.      

## 2015-09-09 NOTE — Anesthesia Preprocedure Evaluation (Signed)

## 2015-09-09 NOTE — Progress Notes (Signed)
Post Partum Day #0 Subjective: no complaints  Objective: Blood pressure 107/64, pulse 53, temperature 97.6 F (36.4 C), temperature source Axillary, resp. rate 18, height 5' 1.5" (1.562 m), weight 181 lb (82.101 kg), last menstrual period 01/03/2015, SpO2 97 %, unknown if currently breastfeeding.  Physical Exam:  General: alert, cooperative and no distress Lochia: appropriate Uterine Fundus: firm Incision: no significant drainage DVT Evaluation: No evidence of DVT seen on physical exam. No cords or calf tenderness.   Recent Labs  09/08/15 1305  HGB 8.6*  HCT 26.5*    Assessment/Plan: Continue current care   LOS: 1 day   Jody Bullock A Creedon Danielski 09/09/2015, 8:56 AM

## 2015-09-09 NOTE — Anesthesia Postprocedure Evaluation (Signed)
Anesthesia Post Note  Patient: Jody Bullock  Procedure(s) Performed: * No procedures listed *  Patient location during evaluation: Mother Baby Anesthesia Type: Epidural Level of consciousness: awake, awake and alert, oriented and patient cooperative Pain management: pain level controlled Vital Signs Assessment: post-procedure vital signs reviewed and stable Respiratory status: spontaneous breathing, nonlabored ventilation and respiratory function stable Cardiovascular status: stable Postop Assessment: no headache, no backache, no signs of nausea or vomiting and patient able to bend at knees Anesthetic complications: no     Last Vitals:  Filed Vitals:   09/09/15 0735 09/09/15 0835  BP: 107/64 110/68  Pulse: 53 55  Temp: 36.4 C 36.7 C  Resp: 18 18    Last Pain:  Filed Vitals:   09/09/15 0952  PainSc: 0-No pain   Pain Goal: Patients Stated Pain Goal: 7 (per patient nurse documentation, patient sleeping) (09/09/15 0250)               Collier Monica L

## 2015-09-09 NOTE — Lactation Note (Signed)
This note was copied from a baby's chart. Lactation Consultation Note  Patient Name: Jody Rickey BarbaraWallis Podoll RUEAV'WToday's Date: 09/09/2015 Reason for consult: Initial assessment   With this mom and LPI, now 7 hours old, and 35 4/[redacted] weeks gestation, but LGA at 6 lbs 10.7 oz. Mom states her dates may be off by a week, but this would still make him a LPI. I reviewed the LPI feeing policy with mom, and set up DEP and instructed mom in the pump use and care. I advised her to hand express after pumping, and mom was able to return demonstrate hand expression fairly well. Mom has easily expressed colostrum. The baby was placed skin to skin with mom, and he did latch and suckle on and off for about 10 minutes, with what appeared to be a deep latch. Mom could feel pulls and tugs, but denied any discomfort. Mom encouraged to feed at least every 3, or more frequently with cues, and to pump every 3, after breast feeding, and feed EBm to baby at next feeding.Basic breast feeding teaching done from the baby and me book, and lactation services also reviewed.  Mom knows to call for questions/concerns.    Maternal Data Formula Feeding for Exclusion: Yes Reason for exclusion: Mother's choice to formula and breast feed on admission Has patient been taught Hand Expression?: Yes Does the patient have breastfeeding experience prior to this delivery?: Yes  Feeding Feeding Type: Breast Fed Length of feed: 10 min (sleepy, on and off sucking)  LATCH Score/Interventions Latch: Repeated attempts needed to sustain latch, nipple held in mouth throughout feeding, stimulation needed to elicit sucking reflex. Intervention(s): Adjust position;Assist with latch  Audible Swallowing: A few with stimulation (easily expressed colostrum) Intervention(s): Skin to skin;Hand expression  Type of Nipple: Everted at rest and after stimulation  Comfort (Breast/Nipple): Soft / non-tender     Hold (Positioning): Assistance needed to correctly  position infant at breast and maintain latch. Intervention(s): Breastfeeding basics reviewed;Support Pillows;Position options;Skin to skin  LATCH Score: 7  Lactation Tools Discussed/Used Pump Review: Setup, frequency, and cleaning;Milk Storage;Other (comment) (hand expression, initiation setting) Initiated by:: c Shatima Zalar RN IBCLC Date initiated:: 09/09/15   Consult Status Consult Status: Follow-up Date: 09/10/15 Follow-up type: In-patient    Jody Bullock, Jody Bullock 09/09/2015, 1:15 PM

## 2015-09-10 LAB — NUSWAB VAGINITIS PLUS (VG+)
CANDIDA GLABRATA, NAA: NEGATIVE
Candida albicans, NAA: NEGATIVE
Chlamydia trachomatis, NAA: NEGATIVE
NEISSERIA GONORRHOEAE, NAA: NEGATIVE
Trich vag by NAA: NEGATIVE

## 2015-09-10 LAB — CBC
HEMATOCRIT: 21.9 % — AB (ref 36.0–46.0)
Hemoglobin: 7.2 g/dL — ABNORMAL LOW (ref 12.0–15.0)
MCH: 28.7 pg (ref 26.0–34.0)
MCHC: 32.9 g/dL (ref 30.0–36.0)
MCV: 87.3 fL (ref 78.0–100.0)
PLATELETS: 142 10*3/uL — AB (ref 150–400)
RBC: 2.51 MIL/uL — ABNORMAL LOW (ref 3.87–5.11)
RDW: 13.3 % (ref 11.5–15.5)
WBC: 11 10*3/uL — AB (ref 4.0–10.5)

## 2015-09-10 LAB — STREP GP B NAA: STREP GROUP B AG: POSITIVE — AB

## 2015-09-10 MED ORDER — SENNOSIDES-DOCUSATE SODIUM 8.6-50 MG PO TABS: 2.0000 | ORAL_TABLET | Freq: Every day | ORAL | Status: DC

## 2015-09-10 MED ORDER — FERROUS SULFATE 325 (65 FE) MG PO TABS
325.0000 mg | ORAL_TABLET | Freq: Three times a day (TID) | ORAL | Status: DC
  Filled 2015-09-10: qty 1

## 2015-09-10 MED ORDER — IBUPROFEN 600 MG PO TABS: 600.0000 mg | ORAL_TABLET | Freq: Four times a day (QID) | ORAL | Status: DC

## 2015-09-10 MED ORDER — OXYCODONE-ACETAMINOPHEN 5-325 MG PO TABS: 1.0000 | ORAL_TABLET | ORAL | Status: DC | PRN

## 2015-09-10 MED ORDER — FUSION PLUS PO CAPS: 1.0000 | ORAL_CAPSULE | Freq: Every day | ORAL | Status: DC

## 2015-09-10 MED ORDER — COCONUT OIL OIL: 1.0000 "application " | TOPICAL_OIL | Status: DC | PRN

## 2015-09-10 NOTE — Progress Notes (Signed)
UR chart review completed.  

## 2015-09-10 NOTE — Clinical Social Work Maternal (Signed)
CLINICAL SOCIAL WORK MATERNAL/CHILD NOTE  Patient Details  Name: Jody Bullock MRN: [id number] Date of Birth: 02/01/1981  Date:  09/10/2015  Clinical Social Worker Initiating Note:  Isamar Nazir MSW, LCSW Date/ Time Initiated:  09/10/15/1000     Child's Name:  Jody Bullock   Legal Guardian:  Jody Bullock and Jody Bullock  Need for Interpreter:  None   Date of Referral:  09/09/15     Reason for Referral:  Current Substance Use/Substance Use During Pregnancy (marijuana)   Referral Source:  Central Nursery   Address:  [address] [place], [place] [address]  Phone number:  [phone]   Household Members:  Minor Children (Jody Bullock: 7 years old), Spouse   Natural Supports (not living in the home):  Immediate Family, Extended Family, Friends   Professional Supports: None   Financial Resources:  Medicaid   Other Resources:  WIC   Cultural/Religious Considerations Which May Impact Care:  None reported  Strengths:  Ability to meet basic needs , Pediatrician chosen , Home prepared for child    Risk Factors/Current Problems:   1. Substance Use-- MOB presents with history of marijuana use during pregnancy. Infant's UDS is positive for marijuana, and umbilical cord drug screen is pending.   Cognitive State:  Able to Concentrate , Alert , Goal Oriented , Linear Thinking , Insightful    Mood/Affect:  Happy , Calm , Comfortable    CSW Assessment:  CSW received request for consult due to MOB presenting with a history of marijuana use during pregnancy.   MOB and FOB presented as easily engaged and receptive to the visit. MOB displayed a full range in affect and was in a pleasant mood.  MOB expressed eagerness and readiness for discharge, and stated that she is looking forward to transitioning home.  Per MOB, the infant's birth was a month earlier than she planned. She was receptive to processing normative anxieties, worries, and fears secondary  to giving birth when the infant's gestational age was 35 weeks and 4 days. She shared that she became less anxious and nervous as she was receiving reassurance from her medical provider and the pediatrician that the infant was healthy. MOB denied any ongoing or lingering anxieties secondary to the infant's early birth. MOB and FOB stated that the home is prepared for the infant. MOB shared that since she anticipated the infant to be born in 3-4 more weeks, some of his items still need to be washed; however, she discussed how the infant's basic needs are met.  She stated that she feels ready and prepared to going home, and stated that the infant's older brother is excited and ready as well.  MOB denied history of mental health complications, and denied history of a perinatal mood and anxiety disorders. MOB presented as attentive and engaged as CSW provided education on the baby blues and perinatal mood disorders. MOB discussed intention to follow up with her medical provider if she notes onset of symptoms.   MOB confirmed history of marijuana use.  She stated that it assisted to decrease nausea in the morning, and increased her appetite. MOB never clarified exact use, but stated that it was occasional. MOB did not clarify last use.   MOB and FOB were informed of the hospital drug screen policy as MOB reported history of marijuana use and had a +UDS on 09/08/15.  MOB and FOB informed that the infant's UDS is also positive, and that the umbilical cord drug panel is pending.    MOB denied additional substance use, and denied belief that marijuana use is a problem.  CSW informed MOB and FOB of referral to CPS, and they denied questions or concerns. MOB denied prior CPS involvement, and asked appropriate questions about what to anticipate and expect if CPS referral is accepted.    MOB denied questions, concerns, or needs at this time. She expressed appreciation for the visit, and agreed to contact CSW if additional needs  arise.   CSW Plan/Description:   1. Patient/Family Education-- perinatal mood and anxiety disorder, hospital drug screen policy  2. [place] Child Protective Service Report  3. Infant's UDS is positive for marijuana. CSW to monitor umbilical cord drug panel. 4. No Further Intervention Required/No Barriers to Discharge    Lillianne Eick N, LCSW 09/10/2015, 11:59 AM  

## 2015-09-10 NOTE — Progress Notes (Signed)
Post Partum Day #1 Subjective: no complaints, up ad lib, voiding and tolerating PO  Objective: Blood pressure 107/64, pulse 55, temperature 98 F (36.7 C), temperature source Oral, resp. rate 18, height 5' 1.5" (1.562 m), weight 181 lb (82.101 kg), last menstrual period 01/03/2015, SpO2 100 %, unknown if currently breastfeeding.  Physical Exam:  General: alert, cooperative and no distress Lochia: appropriate Uterine Fundus: firm Incision: none DVT Evaluation: No evidence of DVT seen on physical exam. No cords or calf tenderness.   Recent Labs  09/08/15 1305 09/10/15 0527  HGB 8.6* 7.2*  HCT 26.5* 21.9*    Assessment/Plan: Plan for discharge tomorrow, Breastfeeding, Lactation consult and Social Work consult Anemia: iron started  LOS: 2 days   Roe CoombsRachelle A Denney, CNM 09/10/2015, 8:32 AM

## 2015-09-10 NOTE — Discharge Summary (Signed)
Obstetric Discharge Summary Reason for Admission: induction of labor, rupture of membranes and had been ruptured for 2 days prior to delivery, delivery at 7685w2d. Prenatal Procedures: ultrasound Intrapartum Procedures: spontaneous vaginal delivery Postpartum Procedures: none Complications-Operative and Postpartum: 1st degree perineal laceration HEMOGLOBIN  Date Value Ref Range Status  09/10/2015 7.2* 12.0 - 15.0 g/dL Final   HCT  Date Value Ref Range Status  09/10/2015 21.9* 36.0 - 46.0 % Final    Physical Exam:  General: alert, cooperative and no distress Lochia: appropriate Uterine Fundus: firm Incision: no significant drainage DVT Evaluation: No evidence of DVT seen on physical exam. No cords or calf tenderness.  Discharge Diagnoses: PPROM, delivered  Discharge Information: Date: 09/10/2015 Activity: pelvic rest Diet: routine Medications: PNV, Ibuprofen, Colace, Iron and Percocet Condition: stable Instructions: refer to practice specific booklet Discharge to: home   Newborn Data: Live born female  Birth Weight: 6 lb 10.7 oz (3025 g) APGAR: 8, 8  Home with mother.  Roe Coombsachelle A Kathye Cipriani, CNM 09/10/2015, 1:17 PM

## 2015-09-24 ENCOUNTER — Ambulatory Visit (INDEPENDENT_AMBULATORY_CARE_PROVIDER_SITE_OTHER): Payer: Medicaid Other | Admitting: Certified Nurse Midwife

## 2015-09-24 ENCOUNTER — Inpatient Hospital Stay (HOSPITAL_COMMUNITY)
Admission: AD | Admit: 2015-09-24 | Discharge: 2015-09-26 | DRG: 776 | Disposition: A | Discharge status: Home / Self Care | Payer: Medicaid Other | Source: Ambulatory Visit | Attending: Obstetrics | Admitting: Obstetrics

## 2015-09-24 ENCOUNTER — Encounter: Payer: Self-pay | Admitting: Certified Nurse Midwife

## 2015-09-24 ENCOUNTER — Encounter (HOSPITAL_COMMUNITY): Payer: Self-pay | Admitting: *Deleted

## 2015-09-24 DIAGNOSIS — O135 Gestational [pregnancy-induced] hypertension without significant proteinuria, complicating the puerperium: Secondary | ICD-10-CM | POA: Diagnosis present

## 2015-09-24 DIAGNOSIS — O1415 Severe pre-eclampsia, complicating the puerperium: Secondary | ICD-10-CM

## 2015-09-24 DIAGNOSIS — R030 Elevated blood-pressure reading, without diagnosis of hypertension: Secondary | ICD-10-CM | POA: Diagnosis not present

## 2015-09-24 DIAGNOSIS — O1495 Unspecified pre-eclampsia, complicating the puerperium: Secondary | ICD-10-CM | POA: Diagnosis present

## 2015-09-24 DIAGNOSIS — Z3A35 35 weeks gestation of pregnancy: Secondary | ICD-10-CM | POA: Diagnosis not present

## 2015-09-24 DIAGNOSIS — IMO0001 Reserved for inherently not codable concepts without codable children: Secondary | ICD-10-CM

## 2015-09-24 DIAGNOSIS — O1490 Unspecified pre-eclampsia, unspecified trimester: Secondary | ICD-10-CM | POA: Diagnosis present

## 2015-09-24 DIAGNOSIS — O1413 Severe pre-eclampsia, third trimester: Secondary | ICD-10-CM | POA: Diagnosis not present

## 2015-09-24 LAB — COMPREHENSIVE METABOLIC PANEL
ALBUMIN: 3.2 g/dL — AB (ref 3.5–5.0)
ALT: 10 U/L — ABNORMAL LOW (ref 14–54)
ANION GAP: 6 (ref 5–15)
AST: 18 U/L (ref 15–41)
Alkaline Phosphatase: 69 U/L (ref 38–126)
BILIRUBIN TOTAL: 0.4 mg/dL (ref 0.3–1.2)
BUN: 13 mg/dL (ref 6–20)
CHLORIDE: 107 mmol/L (ref 101–111)
CO2: 27 mmol/L (ref 22–32)
Calcium: 9 mg/dL (ref 8.9–10.3)
Creatinine, Ser: 0.86 mg/dL (ref 0.44–1.00)
GFR calc Af Amer: >60 mL/min (ref >60)
GFR calc non Af Amer: >60 mL/min (ref >60)
GLUCOSE: 90 mg/dL (ref 65–99)
POTASSIUM: 4.1 mmol/L (ref 3.5–5.1)
SODIUM: 140 mmol/L (ref 135–145)
TOTAL PROTEIN: 6.6 g/dL (ref 6.5–8.1)

## 2015-09-24 LAB — CBC
HEMATOCRIT: 30.6 % — AB (ref 36.0–46.0)
Hemoglobin: 9.8 g/dL — ABNORMAL LOW (ref 12.0–15.0)
MCH: 27.8 pg (ref 26.0–34.0)
MCHC: 32 g/dL (ref 30.0–36.0)
MCV: 86.9 fL (ref 78.0–100.0)
PLATELETS: 214 10*3/uL (ref 150–400)
RBC: 3.52 MIL/uL — AB (ref 3.87–5.11)
RDW: 14.3 % (ref 11.5–15.5)
WBC: 6 10*3/uL (ref 4.0–10.5)

## 2015-09-24 LAB — PROTEIN / CREATININE RATIO, URINE
Creatinine, Urine: 46 mg/dL
Total Protein, Urine: <6 mg/dL

## 2015-09-24 MED ORDER — LACTATED RINGERS IV SOLN
2.0000 g/h | INTRAVENOUS | Status: AC
  Administered 2015-09-24 – 2015-09-25 (×2): 2 g/h via INTRAVENOUS
  Filled 2015-09-24 (×2): qty 80

## 2015-09-24 MED ORDER — NIFEDIPINE 10 MG PO CAPS
30.0000 mg | ORAL_CAPSULE | Freq: Once | ORAL | Status: AC
  Administered 2015-09-24: 30 mg via ORAL
  Filled 2015-09-24: qty 3

## 2015-09-24 MED ORDER — LABETALOL HCL 200 MG PO TABS
200.0000 mg | ORAL_TABLET | Freq: Three times a day (TID) | ORAL | Status: DC
  Administered 2015-09-24 – 2015-09-26 (×6): 200 mg via ORAL
  Filled 2015-09-24 (×6): qty 1

## 2015-09-24 MED ORDER — LACTATED RINGERS IV SOLN
INTRAVENOUS | Status: AC
  Administered 2015-09-24 – 2015-09-25 (×3): via INTRAVENOUS

## 2015-09-24 MED ORDER — MAGNESIUM SULFATE BOLUS VIA INFUSION
4.0000 g | Freq: Once | INTRAVENOUS | Status: AC
  Administered 2015-09-24: 4 g via INTRAVENOUS
  Filled 2015-09-24: qty 500

## 2015-09-24 MED ORDER — BUTALBITAL-APAP-CAFFEINE 50-325-40 MG PO TABS
2.0000 | ORAL_TABLET | Freq: Four times a day (QID) | ORAL | Status: DC | PRN
  Administered 2015-09-24 – 2015-09-25 (×2): 2 via ORAL
  Filled 2015-09-24 (×2): qty 2

## 2015-09-24 MED ORDER — LABETALOL HCL 5 MG/ML IV SOLN: 20.0000 mg | INTRAVENOUS | Status: DC | PRN

## 2015-09-24 MED ORDER — HYDRALAZINE HCL 20 MG/ML IJ SOLN: 10.0000 mg | Freq: Once | INTRAMUSCULAR | Status: DC | PRN

## 2015-09-24 NOTE — H&P (Signed)
 @LOGO @  Jody Bullock is an 35 y.o. female. Two weeks postpartum.  Elevated BP in office.  Denies HA or visual changes  No LMP recorded.    Past Medical History  Diagnosis Date  . Medical history non-contributory     Past Surgical History  Procedure Laterality Date  . No past surgeries      Family History  Problem Relation Age of Onset  . Gallbladder disease Mother   . Cancer Father     prostate     Social History:  reports that she has never smoked. She has never used smokeless tobacco. She reports that she uses illicit drugs (Marijuana). She reports that she does not drink alcohol.  Allergies: No Known Allergies  Prescriptions prior to admission  Medication Sig Dispense Refill Last Dose  . coconut oil OIL Apply 1 application topically as needed. 500 mL PRN Taking  . ibuprofen (ADVIL,MOTRIN) 600 MG tablet Take 1 tablet (600 mg total) by mouth 4 (four) times daily. 120 tablet 0 09/15/2015  . oxyCODONE-acetaminophen (PERCOCET/ROXICET) 5-325 MG tablet Take 1-2 tablets by mouth every 4 (four) hours as needed for moderate pain or severe pain. (Patient not taking: Reported on 09/24/2015) 45 tablet 0   . Prenat-FePoly-Metf-FA-DHA-DSS (VITAFOL FE+) 90-1-200 & 50 MG CPPK Take 2 tablets by mouth daily. 60 each 12 09/22/2015    REVIEW OF SYSTEMS: A ROS was performed and pertinent positives and negatives are included in the history.  GENERAL: No fevers or chills. HEENT: No change in vision, no earache, sore throat or sinus congestion. NECK: No pain or stiffness. CARDIOVASCULAR: No chest pain or pressure. No palpitations. PULMONARY: No shortness of breath, cough or wheeze. GASTROINTESTINAL: No abdominal pain, nausea, vomiting or diarrhea, melena or bright red blood per rectum. GENITOURINARY: No urinary frequency, urgency, hesitancy or dysuria. MUSCULOSKELETAL: No joint or muscle pain, no back pain, no recent trauma. DERMATOLOGIC: No rash, no itching, no lesions. ENDOCRINE: No polyuria,  polydipsia, no heat or cold intolerance. No recent change in weight. HEMATOLOGICAL: No anemia or easy bruising or bleeding. NEUROLOGIC: No headache, seizures, numbness, tingling or weakness. PSYCHIATRIC: No depression, no loss of interest in normal activity or change in sleep pattern.     Blood pressure 128/83, pulse 70, temperature 97.7 F (36.5 C), temperature source Oral, resp. rate 15, height 5' 1.5" (1.562 m), weight 173 lb (78.472 kg), SpO2 99 %, unknown if currently breastfeeding.  Physical Exam:  HEENT:unremarkable Neck:Supple, midline, no thyroid megaly, no carotid bruits Lungs:  Clear to auscultation no rhonchi's or wheezes Heart:Regular rate and rhythm, no murmurs or gallops Breast Exam:normal Abdomen:soft, nontender   Results for orders placed or performed during the hospital encounter of 09/24/15 (from the past 24 hour(s))  Protein / creatinine ratio, urine     Status: None   Collection Time: 09/24/15 11:50 AM  Result Value Ref Range   Creatinine, Urine 46.00 mg/dL   Total Protein, Urine <6 mg/dL   Protein Creatinine Ratio        0.00 - 0.15 mg/mg[Cre]  Comprehensive metabolic panel     Status: Abnormal   Collection Time: 09/24/15 12:23 PM  Result Value Ref Range   Sodium 140 135 - 145 mmol/L   Potassium 4.1 3.5 - 5.1 mmol/L   Chloride 107 101 - 111 mmol/L   CO2 27 22 - 32 mmol/L   Glucose, Bld 90 65 - 99 mg/dL   BUN 13 6 - 20 mg/dL   Creatinine, Ser 0.450.86 0.44 - 1.00 mg/dL  Calcium 9.0 8.9 - 10.3 mg/dL   Total Protein 6.6 6.5 - 8.1 g/dL   Albumin 3.2 (L) 3.5 - 5.0 g/dL   AST 18 15 - 41 U/L   ALT 10 (L) 14 - 54 U/L   Alkaline Phosphatase 69 38 - 126 U/L   Total Bilirubin 0.4 0.3 - 1.2 mg/dL   GFR calc non Af Amer >60 >60 mL/min   GFR calc Af Amer >60 >60 mL/min   Anion gap 6 5 - 15  CBC     Status: Abnormal   Collection Time: 09/24/15 12:23 PM  Result Value Ref Range   WBC 6.0 4.0 - 10.5 K/uL   RBC 3.52 (L) 3.87 - 5.11 MIL/uL   Hemoglobin 9.8 (L) 12.0 -  15.0 g/dL   HCT 16.1 (L) 09.6 - 04.5 %   MCV 86.9 78.0 - 100.0 fL   MCH 27.8 26.0 - 34.0 pg   MCHC 32.0 30.0 - 36.0 g/dL   RDW 40.9 81.1 - 91.4 %   Platelets 214 150 - 400 K/uL    No results found.  Assessment/Plan: Two weeks postpartum.  Preeclampsia.  Admit.  Jody Bullock A 09/24/2015, 9:50 PM

## 2015-09-24 NOTE — MAU Note (Signed)
Patient sitting up eating, fhr difficult to trace

## 2015-09-24 NOTE — Progress Notes (Signed)
Patient ID: Jody Bullock, female   DOB: [birth year], 35 y.o.   MRN: [id number]  Subjective:     Jody Bullock is a 35 y.o. female who presents for a postpartum visit. She is 2 weeks postpartum following a spontaneous vaginal delivery. I have fully reviewed the prenatal and intrapartum course. The delivery was at 39 gestational weeks. Outcome: spontaneous vaginal delivery. Anesthesia: epidural. Postpartum course has been normal. Baby's course has been normal. Baby is feeding by breast. Bleeding moderate lochia, brown and red. Bowel function is normal. Bladder function is normal. Patient is not sexually active. Contraception method is abstinence. Postpartum depression screening: negative.  Tobacco, alcohol and substance abuse history reviewed.  Adult immunizations reviewed including TDAP, rubella and varicella.  The following portions of the patient's history were reviewed and updated as appropriate: allergies, current medications, past family history, past medical history, past social history, past surgical history and problem list.  Review of Systems Pertinent items noted in HPI and remainder of comprehensive ROS otherwise negative.   Objective:    BP 152/88 mmHg  Pulse 54  Wt 173 lb (78.472 kg)  LMP 01/03/2015   B/P: 156/90; 148/92  General:  alert, cooperative and no distress   Breasts:  inspection negative, no nipple discharge or bleeding, no masses or nodularity palpable  Lungs: clear to auscultation bilaterally  Heart:  regular rate and rhythm, S1, S2 normal, no murmur, click, rub or gallop  Abdomen: soft, non-tender; bowel sounds normal; no masses,  no organomegaly   Vulva:  not evaluated  Vagina: not evaluated  Cervix:  not evaluated  Corpus: not examined  Adnexa:  not evaluated  Rectal Exam: Not performed.          50% of 15 min visit spent on counseling and coordination of care.  Assessment:     Normal 2 week postpartum exam. Pap smear not done at today's visit.    Blood pressure elevated today: has been normotensive, no hx of elevated blood pressures  Plan:   Sent to MAU for evaluation of elevated blood pressures.   1. Contraception: abstinence 2.  Planning nuva ring vaginal insers 3. Follow up in: 4 weeks or as needed.  2hr GTT for h/o GDM/screening for DM q 3 yrs per ADA recommendations Preconception counseling provided Healthy lifestyle practices reviewed

## 2015-09-24 NOTE — MAU Note (Signed)
Patient sent by office for evaluation of BP post delivery (09/09/15). Denies pain or discharge. Normal post delivery bleeding.

## 2015-09-24 NOTE — MAU Provider Note (Signed)
MAU HISTORY AND PHYSICAL  Chief Complaint:  Hypertension   Jody Bullock is a 35 y.o.  [id number] presenting for Hypertension  Denies hx chtn. NSVD 4/27. Denies hypertension antepartum or peripartum. No ha, vision change, ruq/epigastric pain, or new sob. Routine 2 wk visit today bp found to be elevated, sent here for further w/u. No swelling, feeling well.   Past Medical History  Diagnosis Date  . Medical history non-contributory     Past Surgical History  Procedure Laterality Date  . No past surgeries      Family History  Problem Relation Age of Onset  . Gallbladder disease Mother   . Cancer Father     prostate     Social History  Substance Use Topics  . Smoking status: Never Smoker   . Smokeless tobacco: Never Used  . Alcohol Use: No    No Known Allergies  Prescriptions prior to admission  Medication Sig Dispense Refill Last Dose  . coconut oil OIL Apply 1 application topically as needed. 500 mL PRN Taking  . ibuprofen (ADVIL,MOTRIN) 600 MG tablet Take 1 tablet (600 mg total) by mouth 4 (four) times daily. 120 tablet 0 09/15/2015  . oxyCODONE-acetaminophen (PERCOCET/ROXICET) 5-325 MG tablet Take 1-2 tablets by mouth every 4 (four) hours as needed for moderate pain or severe pain. (Patient not taking: Reported on 09/24/2015) 45 tablet 0   . Prenat-FePoly-Metf-FA-DHA-DSS (VITAFOL FE+) 90-1-200 & 50 MG CPPK Take 2 tablets by mouth daily. 60 each 12 09/22/2015    Review of Systems - Negative except for what is mentioned in HPI.  Physical Exam  Blood pressure 130/73, pulse 77, resp. rate 16, height 5' 1.5" (1.562 m), unknown if currently breastfeeding. GENERAL: Well-developed, well-nourished female in no acute distress.  LUNGS: Clear to auscultation bilaterally.  HEART: Regular rate and rhythm. ABDOMEN: Soft, nontender, nondistended.  EXTREMITIES: Nontender, no edema, 2+ distal pulses.    Labs: Results for orders placed or performed during the hospital encounter of  09/24/15 (from the past 24 hour(s))  Protein / creatinine ratio, urine   Collection Time: 09/24/15 11:50 AM  Result Value Ref Range   Creatinine, Urine 46.00 mg/dL   Total Protein, Urine <6 mg/dL   Protein Creatinine Ratio        0.00 - 0.15 mg/mg[Cre]  Comprehensive metabolic panel   Collection Time: 09/24/15 12:23 PM  Result Value Ref Range   Sodium 140 135 - 145 mmol/L   Potassium 4.1 3.5 - 5.1 mmol/L   Chloride 107 101 - 111 mmol/L   CO2 27 22 - 32 mmol/L   Glucose, Bld 90 65 - 99 mg/dL   BUN 13 6 - 20 mg/dL   Creatinine, Ser 4.54 0.44 - 1.00 mg/dL   Calcium 9.0 8.9 - 09.8 mg/dL   Total Protein 6.6 6.5 - 8.1 g/dL   Albumin 3.2 (L) 3.5 - 5.0 g/dL   AST 18 15 - 41 U/L   ALT 10 (L) 14 - 54 U/L   Alkaline Phosphatase 69 38 - 126 U/L   Total Bilirubin 0.4 0.3 - 1.2 mg/dL   GFR calc non Af Amer >60 >60 mL/min   GFR calc Af Amer >60 >60 mL/min   Anion gap 6 5 - 15  CBC   Collection Time: 09/24/15 12:23 PM  Result Value Ref Range   WBC 6.0 4.0 - 10.5 K/uL   RBC 3.52 (L) 3.87 - 5.11 MIL/uL   Hemoglobin 9.8 (L) 12.0 - 15.0 g/dL   HCT 11.9 (  L) 36.0 - 46.0 %   MCV 86.9 78.0 - 100.0 fL   MCH 27.8 26.0 - 34.0 pg   MCHC 32.0 30.0 - 36.0 g/dL   RDW 09.814.3 11.911.5 - 14.715.5 %   Platelets 214 150 - 400 K/uL    Imaging Studies:  [place] Mfm Ob Limited  09/08/2015  OBSTETRICAL ULTRASOUND: This exam was performed within a [facility]. The OB US report was generated in the AS system, and faxed to the ordering physician.  This report is available in the [facility] PACS. See the [facility] Obstetric US report via the Image Link.   Assessment: Jody Bullock is  35 y.o. [id number] at 2067w4d presents with severe preeclampsia (BP). Labs unremarkable and asymptomatic. BP normalized w/ procardia. Discussed w/ Dr. Clearance CootsHarper.  Plan: - admit, start labetalol 200 bid, give magnesium for seizure prophylaxis  Silvano Bilisoah B Wouk 5/12/20173:11 PM

## 2015-09-25 ENCOUNTER — Encounter (HOSPITAL_COMMUNITY): Payer: Self-pay | Admitting: *Deleted

## 2015-09-25 MED ORDER — SODIUM CHLORIDE 0.9% FLUSH
3.0000 mL | Freq: Two times a day (BID) | INTRAVENOUS | Status: DC
  Administered 2015-09-25: 3 mL via INTRAVENOUS

## 2015-09-25 MED ORDER — SODIUM CHLORIDE 0.9% FLUSH: 3.0000 mL | INTRAVENOUS | Status: DC | PRN

## 2015-09-25 MED ORDER — BUTALBITAL-APAP-CAFFEINE 50-325-40 MG PO TABS: 2.0000 | ORAL_TABLET | Freq: Four times a day (QID) | ORAL | Status: DC | PRN

## 2015-09-25 MED ORDER — SODIUM CHLORIDE 0.9 % IV SOLN: 250.0000 mL | INTRAVENOUS | Status: DC | PRN

## 2015-09-25 MED ORDER — BUTALBITAL-APAP-CAFFEINE 50-325-40 MG PO TABS
2.0000 | ORAL_TABLET | Freq: Four times a day (QID) | ORAL | Status: DC | PRN
  Administered 2015-09-25 – 2015-09-26 (×2): 2 via ORAL
  Filled 2015-09-25 (×2): qty 2

## 2015-09-25 NOTE — Progress Notes (Signed)
Subjective: Patient reports no complaints.  Objective: I have reviewed patient's vital signs, intake and output, medications and labs.  General: alert and no distress Resp: clear to auscultation bilaterally Cardio: regular rate and rhythm, S1, S2 normal, no murmur, click, rub or gallop GI: soft, non-tender; bowel sounds normal; no masses,  no organomegaly Extremities: extremities normal, atraumatic, no cyanosis or edema   Assessment/Plan: Postpartum preeclampsia.  Stable.  Continue magnesium sulfate / Labetalol.   LOS: 1 day    HARPER,CHARLES A 09/25/2015, 8:16 AM

## 2015-09-26 ENCOUNTER — Encounter (HOSPITAL_COMMUNITY): Payer: Self-pay | Admitting: *Deleted

## 2015-09-26 ENCOUNTER — Inpatient Hospital Stay (HOSPITAL_COMMUNITY)
Admission: AD | Admit: 2015-09-26 | Discharge: 2015-09-26 | Disposition: A | Discharge status: Home / Self Care | Payer: Medicaid Other | Source: Ambulatory Visit | Attending: Obstetrics | Admitting: Obstetrics

## 2015-09-26 DIAGNOSIS — O1413 Severe pre-eclampsia, third trimester: Secondary | ICD-10-CM

## 2015-09-26 DIAGNOSIS — O135 Gestational [pregnancy-induced] hypertension without significant proteinuria, complicating the puerperium: Secondary | ICD-10-CM | POA: Diagnosis not present

## 2015-09-26 DIAGNOSIS — Z3A35 35 weeks gestation of pregnancy: Secondary | ICD-10-CM | POA: Diagnosis not present

## 2015-09-26 LAB — COMPREHENSIVE METABOLIC PANEL
ALK PHOS: 75 U/L (ref 38–126)
ALT: 11 U/L — ABNORMAL LOW (ref 14–54)
ANION GAP: 5 (ref 5–15)
AST: 20 U/L (ref 15–41)
Albumin: 3.2 g/dL — ABNORMAL LOW (ref 3.5–5.0)
BUN: 11 mg/dL (ref 6–20)
CALCIUM: 8.8 mg/dL — AB (ref 8.9–10.3)
CO2: 27 mmol/L (ref 22–32)
Chloride: 109 mmol/L (ref 101–111)
Creatinine, Ser: 0.98 mg/dL (ref 0.44–1.00)
GFR calc non Af Amer: >60 mL/min (ref >60)
Glucose, Bld: 69 mg/dL (ref 65–99)
Potassium: 4 mmol/L (ref 3.5–5.1)
SODIUM: 141 mmol/L (ref 135–145)
Total Bilirubin: 0.7 mg/dL (ref 0.3–1.2)
Total Protein: 6.4 g/dL — ABNORMAL LOW (ref 6.5–8.1)

## 2015-09-26 LAB — CBC
HCT: 30.2 % — ABNORMAL LOW (ref 36.0–46.0)
HEMOGLOBIN: 9.7 g/dL — AB (ref 12.0–15.0)
MCH: 28 pg (ref 26.0–34.0)
MCHC: 32.1 g/dL (ref 30.0–36.0)
MCV: 87.3 fL (ref 78.0–100.0)
PLATELETS: 220 10*3/uL (ref 150–400)
RBC: 3.46 MIL/uL — ABNORMAL LOW (ref 3.87–5.11)
RDW: 14.7 % (ref 11.5–15.5)
WBC: 4.4 10*3/uL (ref 4.0–10.5)

## 2015-09-26 LAB — URINE MICROSCOPIC-ADD ON

## 2015-09-26 LAB — PROTEIN / CREATININE RATIO, URINE
Creatinine, Urine: 216 mg/dL
PROTEIN CREATININE RATIO: 0.07 mg/mg{creat} (ref 0.00–0.15)
TOTAL PROTEIN, URINE: 15 mg/dL

## 2015-09-26 LAB — URINALYSIS, ROUTINE W REFLEX MICROSCOPIC
Bilirubin Urine: NEGATIVE
GLUCOSE, UA: NEGATIVE mg/dL
KETONES UR: 15 mg/dL — AB
Nitrite: NEGATIVE
PROTEIN: NEGATIVE mg/dL
Specific Gravity, Urine: 1.02 (ref 1.005–1.030)
pH: 6 (ref 5.0–8.0)

## 2015-09-26 MED ORDER — LABETALOL HCL 100 MG PO TABS
200.0000 mg | ORAL_TABLET | Freq: Once | ORAL | Status: AC
  Administered 2015-09-26: 200 mg via ORAL
  Filled 2015-09-26: qty 2

## 2015-09-26 MED ORDER — ACETAMINOPHEN 500 MG PO TABS
1000.0000 mg | ORAL_TABLET | Freq: Once | ORAL | Status: AC
  Administered 2015-09-26: 1000 mg via ORAL
  Filled 2015-09-26: qty 2

## 2015-09-26 MED ORDER — LABETALOL HCL 200 MG PO TABS: 200.0000 mg | ORAL_TABLET | Freq: Three times a day (TID) | ORAL | Status: DC

## 2015-09-26 NOTE — Progress Notes (Signed)
Subjective: Patient reports no complaints.  Objective: I have reviewed patient's vital signs, intake and output, medications and labs.  General: alert and no distress Resp: clear to auscultation bilaterally Cardio: regular rate and rhythm, S1, S2 normal, no murmur, click, rub or gallop GI: soft, non-tender; bowel sounds normal; no masses,  no organomegaly Extremities: extremities normal, atraumatic, no cyanosis or edema   Assessment/Plan: Postpartum preeclampsia.  Stable.  Discharge home.on po Labetalol.  F/U in office in 1 week.   LOS: 2 days    HARPER,CHARLES A 09/26/2015, 6:22 AM

## 2015-09-26 NOTE — Discharge Instructions (Signed)

## 2015-09-26 NOTE — MAU Provider Note (Signed)
History     CSN: [id number]  Arrival date and time: 09/26/15 1931   First Provider Initiated Contact with Patient 09/26/15 2009      Chief Complaint  Patient presents with  . Hypertension   HPI Comments: Jody Bullock L Yousuf is a [age] y.o. [id number] who is S/P NSVD on 09/09/15. She was here for PP pre-eclampsia and was on mag for 24 hours. She was DC home earlier today. She was sent home on labetalol 200mg  q 8 hours. She states that she last took a dose around 1400 today. She states that she took her blood pressure at home around 1930, and it was 180/100. She states that she did not have any sx or reason to check her blood pressure.   Hypertension This is a new problem. The current episode started today. The problem is unchanged. The problem is uncontrolled. Associated symptoms include headaches (2/10 ). Pertinent negatives include no blurred vision. Associated agents: pregnancy  Treatments tried: labetalol  The current treatment provides mild improvement. There are no compliance problems.     Past Medical History  Diagnosis Date  . Medical history non-contributory     Past Surgical History  Procedure Laterality Date  . No past surgeries      Family History  Problem Relation Age of Onset  . Gallbladder disease Mother   . Cancer Father     prostate     Social History  Substance Use Topics  . Smoking status: Never Smoker   . Smokeless tobacco: Never Used  . Alcohol Use: No    Allergies: No Known Allergies  Prescriptions prior to admission  Medication Sig Dispense Refill Last Dose  . coconut oil OIL Apply 1 application topically as needed. 500 mL PRN Taking  . labetalol (NORMODYNE) 200 MG tablet Take 1 tablet (200 mg total) by mouth 3 (three) times daily. 90 tablet 0   . oxyCODONE-acetaminophen (PERCOCET/ROXICET) 5-325 MG tablet Take 1-2 tablets by mouth every 4 (four) hours as needed for moderate pain or severe pain. (Patient not taking: Reported on 09/24/2015) 45 tablet 0   .  Prenat-FePoly-Metf-FA-DHA-DSS (VITAFOL FE+) 90-1-200 & 50 MG CPPK Take 2 tablets by mouth daily. 60 each 12 09/22/2015    Review of Systems  Constitutional: Negative for fever and chills.  Eyes: Negative for blurred vision.  Gastrointestinal: Negative for abdominal pain.  Neurological: Positive for headaches (2/10 ).   Physical Exam   Blood pressure 153/77, pulse 49, temperature 98.4 F (36.9 C), temperature source Oral, resp. rate 16, height 5' 1.5" (1.562 m), weight 75.297 kg (166 lb), unknown if currently breastfeeding.  Physical Exam  Nursing note and vitals reviewed. Constitutional: She is oriented to person, place, and time. She appears well-developed and well-nourished. No distress.  HENT:  Head: Normocephalic.  Cardiovascular: Normal rate.   Respiratory: Effort normal.  GI: Soft. There is no tenderness.  Neurological: She is alert and oriented to person, place, and time.  Skin: Skin is warm and dry.  Psychiatric: She has a normal mood and affect.   Results for orders placed or performed during the hospital encounter of 09/26/15 (from the past 24 hour(s))  CBC     Status: Abnormal   Collection Time: 09/26/15  8:21 PM  Result Value Ref Range   WBC 4.4 4.0 - 10.5 K/uL   RBC 3.46 (L) 3.87 - 5.11 MIL/uL   Hemoglobin 9.7 (L) 12.0 - 15.0 g/dL   HCT 85.430.2 (L) 62.736.0 - 03.546.0 %   MCV 87.3  78.0 - 100.0 fL   MCH 28.0 26.0 - 34.0 pg   MCHC 32.1 30.0 - 36.0 g/dL   RDW 16.1 09.6 - 04.5 %   Platelets 220 150 - 400 K/uL  Comprehensive metabolic panel     Status: Abnormal   Collection Time: 09/26/15  8:21 PM  Result Value Ref Range   Sodium 141 135 - 145 mmol/L   Potassium 4.0 3.5 - 5.1 mmol/L   Chloride 109 101 - 111 mmol/L   CO2 27 22 - 32 mmol/L   Glucose, Bld 69 65 - 99 mg/dL   BUN 11 6 - 20 mg/dL   Creatinine, Ser 4.09 0.44 - 1.00 mg/dL   Calcium 8.8 (L) 8.9 - 10.3 mg/dL   Total Protein 6.4 (L) 6.5 - 8.1 g/dL   Albumin 3.2 (L) 3.5 - 5.0 g/dL   AST 20 15 - 41 U/L   ALT 11  (L) 14 - 54 U/L   Alkaline Phosphatase 75 38 - 126 U/L   Total Bilirubin 0.7 0.3 - 1.2 mg/dL   GFR calc non Af Amer >60 >60 mL/min   GFR calc Af Amer >60 >60 mL/min   Anion gap 5 5 - 15  Protein / creatinine ratio, urine     Status: None   Collection Time: 09/26/15  8:30 PM  Result Value Ref Range   Creatinine, Urine 216.00 mg/dL   Total Protein, Urine 15 mg/dL   Protein Creatinine Ratio 0.07 0.00 - 0.15 mg/mg[Cre]  Urinalysis, Routine w reflex microscopic (not at [facility])     Status: Abnormal   Collection Time: 09/26/15  8:30 PM  Result Value Ref Range   Color, Urine YELLOW YELLOW   APPearance CLEAR CLEAR   Specific Gravity, Urine 1.020 1.005 - 1.030   pH 6.0 5.0 - 8.0   Glucose, UA NEGATIVE NEGATIVE mg/dL   Hgb urine dipstick MODERATE (A) NEGATIVE   Bilirubin Urine NEGATIVE NEGATIVE   Ketones, ur 15 (A) NEGATIVE mg/dL   Protein, ur NEGATIVE NEGATIVE mg/dL   Nitrite NEGATIVE NEGATIVE   Leukocytes, UA TRACE (A) NEGATIVE  Urine microscopic-add on     Status: Abnormal   Collection Time: 09/26/15  8:30 PM  Result Value Ref Range   Squamous Epithelial / LPF 0-5 (A) NONE SEEN   WBC, UA 0-5 0 - 5 WBC/hpf   RBC / HPF 6-30 0 - 5 RBC/hpf   Bacteria, UA RARE (A) NONE SEEN    MAU Course  Procedures  MDM 2213: D/W Dr. Clearance Coots, ok for DC home. FU in the office for BP check tomorrow.   Assessment and Plan   1. Gestational hypertension without significant proteinuria, postpartum    DC home Comfort measures reviewed  Pre-eclampsia warning signs  RX: none  Return to MAU as needed   Follow-up Information    Follow up with HARPER,CHARLES A, MD.   Specialty:  Obstetrics and Gynecology   Why:  Call for an appointment to be seen tomorrow for blood pressure check    Contact information:   [address] [place] [address] [phone]         Tawnya Crook 09/26/2015, 8:16 PM

## 2015-09-26 NOTE — MAU Note (Addendum)
Patient presents post delivery from 4/27 with increased blood pressure. Was admitted on Friday and Mag'd for 24 hours. Released this morning. States her blood pressure this evening was 180/110 range. Discharged with a prescription of labetalol 200mg  to be taken every 8 hours. Last dose taken at 1400 today. Denies pain or discharge and states she has "normal post delivery vaginal bleeding."

## 2015-09-26 NOTE — Discharge Summary (Signed)
Physician Discharge Summary  Patient ID: Jody Bullock MRN: [id number] DOB/AGE: 1980/06/28 35 y.o.  Admit date: 09/24/2015 Discharge date: 09/26/2015  Admission Diagnoses:  Postpartum preeclampsia  Discharge Diagnoses:  Same Active Problems:   Preeclampsia   Discharged Condition: good  Hospital Course:  Admitted with elevated BP 2 weeks postpartum.  Responded well to magnesium sulfate / Labetalol.  Discharged home in good condition on po Labetalol.  Consults: None  Significant Diagnostic Studies:   Lab Results for MAEDELL, HEDGER (MRN [id number]) as of 09/26/2015 06:32  Ref. Range 09/24/2015 12:23  Sodium Latest Ref Range: 135-145 mmol/L 140  Potassium Latest Ref Range: 3.5-5.1 mmol/L 4.1  Chloride Latest Ref Range: 101-111 mmol/L 107  CO2 Latest Ref Range: 22-32 mmol/L 27  BUN Latest Ref Range: 6-20 mg/dL 13  Creatinine Latest Ref Range: 0.44-1.00 mg/dL 0.86  Calcium Latest Ref Range: 8.9-10.3 mg/dL 9.0  EGFR (Non-African Amer.) Latest Ref Range: >60 mL/min >60  EGFR (African American) Latest Ref Range: >60 mL/min >60  Glucose Latest Ref Range: 65-99 mg/dL 90  Anion gap Latest Ref Range: 5-15  6  Alkaline Phosphatase Latest Ref Range: 38-126 U/L 69  Albumin Latest Ref Range: 3.5-5.0 g/dL 3.2 (L)  AST Latest Ref Range: 15-41 U/L 18  ALT Latest Ref Range: 14-54 U/L 10 (L)  Total Protein Latest Ref Range: 6.5-8.1 g/dL 6.6  Total Bilirubin Latest Ref Range: 0.3-1.2 mg/dL 0.4  WBC Latest Ref Range: 4.0-10.5 K/uL 6.0  RBC Latest Ref Range: 3.87-5.11 MIL/uL 3.52 (L)  Hemoglobin Latest Ref Range: 12.0-15.0 g/dL 9.8 (L)  HCT Latest Ref Range: 36.0-46.0 % 30.6 (L)  MCV Latest Ref Range: 78.0-100.0 fL 86.9  MCH Latest Ref Range: 26.0-34.0 pg 27.8  MCHC Latest Ref Range: 30.0-36.0 g/dL 32.0  RDW Latest Ref Range: 11.5-15.5 % 14.3  Platelets Latest Ref Range: 150-400 K/uL 214   Treatments: IV hydration, cardiac meds: labetolol and magnesium sulfate  Discharge Exam: Blood  pressure 130/80, pulse 57, temperature 98.6 F (37 C), temperature source Oral, resp. rate 18, height 5' 1.5" (1.562 m), weight 166 lb (75.297 kg), SpO2 100 %, unknown if currently breastfeeding. General appearance: alert and no distress Resp: clear to auscultation bilaterally Cardio: regular rate and rhythm, S1, S2 normal, no murmur, click, rub or gallop GI: soft, non-tender; bowel sounds normal; no masses,  no organomegaly Extremities: extremities normal, atraumatic, no cyanosis or edema  Disposition: 01-Home or Self Care  Discharge Instructions    Diet - low sodium heart healthy    Complete by:  As directed             Medication List    STOP taking these medications        ibuprofen 600 MG tablet  Commonly known as:  ADVIL,MOTRIN      TAKE these medications        coconut oil Oil  Apply 1 application topically as needed.     labetalol 200 MG tablet  Commonly known as:  NORMODYNE  Take 1 tablet (200 mg total) by mouth 3 (three) times daily.     oxyCODONE-acetaminophen 5-325 MG tablet  Commonly known as:  PERCOCET/ROXICET  Take 1-2 tablets by mouth every 4 (four) hours as needed for moderate pain or severe pain.     VITAFOL FE+ 90-1-200 & 50 MG Cppk  Take 2 tablets by mouth daily.           Follow-up Information    Follow up with HARPER,CHARLES A, MD. Schedule an appointment  as soon as possible for a visit in 1 week.   Specialty:  Obstetrics and Gynecology   Contact information:   [address] [place] [address] [phone]       Signed: Shelly Bombard 09/26/2015, 6:30 AM

## 2015-09-27 ENCOUNTER — Encounter: Payer: Self-pay | Admitting: Obstetrics

## 2015-09-27 ENCOUNTER — Ambulatory Visit (INDEPENDENT_AMBULATORY_CARE_PROVIDER_SITE_OTHER): Payer: Medicaid Other | Admitting: Obstetrics

## 2015-09-27 VITALS — BP 135/81 | HR 51 | Temp 98.7°F | Wt 167.0 lb

## 2015-09-27 DIAGNOSIS — O1002 Pre-existing essential hypertension complicating childbirth: Secondary | ICD-10-CM

## 2015-09-27 MED ORDER — AMLODIPINE BESYLATE 5 MG PO TABS: 5.0000 mg | ORAL_TABLET | Freq: Every day | ORAL | Status: DC

## 2015-09-28 ENCOUNTER — Encounter: Payer: Self-pay | Admitting: Obstetrics

## 2015-09-28 NOTE — Progress Notes (Signed)
S:  Patient presents for BP check postpartum.  Recently admitted for postpartum HTN.  O:  BP 135/81 A/P:  Hypertension, 2 weeks postpartum.  Stable.          Continue Labetalol.  Norvasc added.          F/U in 2 weeks  Isabel Freese A. Clearance CootsHarper MD

## 2015-10-05 ENCOUNTER — Ambulatory Visit (INDEPENDENT_AMBULATORY_CARE_PROVIDER_SITE_OTHER): Payer: Medicaid Other | Admitting: Obstetrics

## 2015-10-05 DIAGNOSIS — R030 Elevated blood-pressure reading, without diagnosis of hypertension: Secondary | ICD-10-CM

## 2015-10-05 DIAGNOSIS — IMO0001 Reserved for inherently not codable concepts without codable children: Secondary | ICD-10-CM

## 2015-10-06 ENCOUNTER — Encounter: Payer: Self-pay | Admitting: Obstetrics

## 2015-10-06 NOTE — Progress Notes (Signed)
Subjective:     Jody Bullock is a 35 y.o. female who presents for a postpartum visit. She is 3 weeks postpartum following a spontaneous vaginal delivery. I have fully reviewed the prenatal and intrapartum course. The delivery was at 35 gestational weeks. Outcome: spontaneous vaginal delivery. Anesthesia: epidural. Postpartum course has been complicated by hypertension. Baby's course has been normal. Baby is feeding by breast. Bleeding thin lochia. Bowel function is normal. Bladder function is normal. Patient is not sexually active. Contraception method is abstinence. Postpartum depression screening: negative.  Tobacco, alcohol and substance abuse history reviewed.  Adult immunizations reviewed including TDAP, rubella and varicella.  The following portions of the patient's history were reviewed and updated as appropriate: allergies, current medications, past family history, past medical history, past social history, past surgical history and problem list.  Review of Systems A comprehensive review of systems was negative.   Objective:    BP 129/83 mmHg  Pulse 59  Wt 168 lb (76.204 kg)   PE:  Deferred   100% of 10 min visit spent on counseling and coordination of care.   Assessment:    Postpartum HTN.  Improved.  F/U in 2 weeks.  Plan:    1. Contraception: considering options  2. Nexplanon Rx 3. Follow up in: 2 weeks or as needed.  2hr GTT for h/o GDM/screening for DM q 3 yrs per ADA recommendations Preconception counseling provided Healthy lifestyle practices reviewed

## 2015-10-15 ENCOUNTER — Ambulatory Visit: Payer: Self-pay | Admitting: Certified Nurse Midwife

## 2015-10-21 ENCOUNTER — Ambulatory Visit: Payer: Self-pay | Admitting: Obstetrics

## 2015-10-21 ENCOUNTER — Ambulatory Visit (INDEPENDENT_AMBULATORY_CARE_PROVIDER_SITE_OTHER): Payer: Medicaid Other | Admitting: Certified Nurse Midwife

## 2015-10-21 ENCOUNTER — Encounter: Payer: Self-pay | Admitting: Certified Nurse Midwife

## 2015-10-21 VITALS — BP 155/99 | HR 58 | Wt 170.0 lb

## 2015-10-21 DIAGNOSIS — Z3049 Encounter for surveillance of other contraceptives: Secondary | ICD-10-CM | POA: Diagnosis not present

## 2015-10-21 DIAGNOSIS — Z30018 Encounter for initial prescription of other contraceptives: Secondary | ICD-10-CM

## 2015-10-21 DIAGNOSIS — IMO0001 Reserved for inherently not codable concepts without codable children: Secondary | ICD-10-CM

## 2015-10-21 DIAGNOSIS — R030 Elevated blood-pressure reading, without diagnosis of hypertension: Secondary | ICD-10-CM | POA: Diagnosis not present

## 2015-10-21 MED ORDER — ETONOGESTREL-ETHINYL ESTRADIOL 0.12-0.015 MG/24HR VA RING: VAGINAL_RING | VAGINAL | Status: DC

## 2015-10-21 MED ORDER — LOSARTAN POTASSIUM-HCTZ 100-25 MG PO TABS: 1.0000 | ORAL_TABLET | Freq: Every day | ORAL | Status: DC

## 2015-10-21 NOTE — Progress Notes (Signed)
Patient ID: Jody Bullock, female   DOB: 1980-07-23, 35 y.o.   MRN: [id number] Subjective:     Jody Bullock is a 35 y.o. female who presents for a postpartum visit. She is 6 weeks postpartum following a spontaneous vaginal delivery. I have fully reviewed the prenatal and intrapartum course. The delivery was at 3375w2d gestational weeks. Outcome: spontaneous vaginal delivery with 1st degree perineal laceration. Anesthesia: epidural. Postpartum course: Postpartum preeclampsia. Baby's course has been uncomplicated. Baby is feeding by unknown. Bleeding unknown. Bowel function is normal. Bladder function is normal. Patient is sexually active. Contraception method is none. Postpartum depression screening: negative.  Tobacco, alcohol and substance abuse history reviewed.  Adult immunizations reviewed including TDAP, rubella and varicella.  The following portions of the patient's history were reviewed and updated as appropriate: allergies, current medications, past family history, past medical history, past social history, past surgical history and problem list.  Review of Systems Pertinent items are noted in HPI.   Objective:    BP 155/99 mmHg  Pulse 58  Wt 170 lb (77.111 kg)  General:  alert, cooperative and no distress  Lungs: clear to auscultation bilaterally  Heart:  regular rate and rhythm, S1, S2 normal, no murmur, click, rub or gallop  Abdomen: soft, non-tender; bowel sounds normal; no masses,  no organomegaly   Vulva:  normal  Vagina: normal vagina  Cervix:  no cervical motion tenderness  Corpus: normal size, contour, position, consistency, mobility, non-tender  Adnexa:  normal adnexa  Rectal Exam: Not performed.          50% of 20 min visit spent on counseling and coordination of care.  Assessment:     Normal 6 postpartum exam. Pap smear not done at today's visit.     Contraception management   Elevated blood pressure  Plan:    1. Contraception: NuvaRing vaginal inserts 2.   Blood pressure management: Hyzar added, lopressor D/C'd 3. Follow up in: 2 weeks for blood pressure check or as needed.  2hr GTT for h/o GDM/screening for DM q 3 yrs per ADA recommendations Preconception counseling provided Healthy lifestyle practices reviewed

## 2015-11-02 ENCOUNTER — Ambulatory Visit: Payer: Self-pay | Admitting: Obstetrics

## 2015-11-04 MED ORDER — LOSARTAN POTASSIUM 100 MG PO TABS: 100.0000 mg | ORAL_TABLET | Freq: Every day | ORAL | Status: DC

## 2015-11-04 MED ORDER — HYDROCHLOROTHIAZIDE 25 MG PO TABS: 25.0000 mg | ORAL_TABLET | Freq: Every day | ORAL | Status: DC

## 2015-11-04 NOTE — Progress Notes (Signed)
No show

## 2016-03-14 ENCOUNTER — Other Ambulatory Visit (HOSPITAL_COMMUNITY)
Admission: RE | Admit: 2016-03-14 | Discharge: 2016-03-14 | Disposition: A | Discharge status: Home / Self Care | Payer: Medicaid Other | Source: Ambulatory Visit | Attending: Certified Nurse Midwife | Admitting: Certified Nurse Midwife

## 2016-03-14 ENCOUNTER — Ambulatory Visit (INDEPENDENT_AMBULATORY_CARE_PROVIDER_SITE_OTHER): Payer: Medicaid Other | Admitting: Obstetrics

## 2016-03-14 ENCOUNTER — Encounter: Payer: Self-pay | Admitting: Obstetrics

## 2016-03-14 DIAGNOSIS — Z124 Encounter for screening for malignant neoplasm of cervix: Secondary | ICD-10-CM

## 2016-03-14 DIAGNOSIS — Z1151 Encounter for screening for human papillomavirus (HPV): Secondary | ICD-10-CM | POA: Diagnosis present

## 2016-03-14 DIAGNOSIS — Z30013 Encounter for initial prescription of injectable contraceptive: Secondary | ICD-10-CM | POA: Diagnosis not present

## 2016-03-14 DIAGNOSIS — Z01419 Encounter for gynecological examination (general) (routine) without abnormal findings: Secondary | ICD-10-CM | POA: Diagnosis not present

## 2016-03-14 DIAGNOSIS — Z3009 Encounter for other general counseling and advice on contraception: Secondary | ICD-10-CM

## 2016-03-14 MED ORDER — MEDROXYPROGESTERONE ACETATE 150 MG/ML IM SUSP: 150.0000 mg | INTRAMUSCULAR | 3 refills | Status: DC

## 2016-03-14 NOTE — Progress Notes (Signed)
Subjective:        Jody Bullock is a 35 y.o. female here for a routine exam.  Current complaints: None.    Personal health questionnaire:  Is patient Ashkenazi Jewish, have a family history of breast and/or ovarian cancer: no Is there a family history of uterine cancer diagnosed at age < 5550, gastrointestinal cancer, urinary tract cancer, family member who is a Personnel officerLynch syndrome-associated carrier: no Is the patient overweight and hypertensive, family history of diabetes, personal history of gestational diabetes, preeclampsia or PCOS: no Is patient over 3455, have PCOS,  family history of premature CHD under age 34, diabetes, smoke, have hypertension or peripheral artery disease:  no At any time, has a partner hit, kicked or otherwise hurt or frightened you?: no Over the past 2 weeks, have you felt down, depressed or hopeless?: no Over the past 2 weeks, have you felt little interest or pleasure in doing things?:no   Gynecologic History Patient's last menstrual period was 03/04/2016. Contraception: Depo-Provera injections Last Pap: 2016. Results were: normal Last mammogram: n/a. Results were: n/a  Obstetric History OB History  Gravida Para Term Preterm AB Living  3 2 1 1 1 2   SAB TAB Ectopic Multiple Live Births  1 0 0 0 2    # Outcome Date GA Lbr Len/2nd Weight Sex Delivery Anes PTL Lv  3 Preterm 09/09/15 3671w4d 08:20 / 00:22 6 lb 10.7 oz (3.025 kg) M Vag-Spont EPI  LIV     Birth Comments: preterm  2 SAB 2014 6461w0d    SAB     1 Term 05/02/09 604w0d  7 lb 6 oz (3.345 kg) M Vag-Spont EPI N LIV      Past Medical History:  Diagnosis Date  . Hypertension   . Medical history non-contributory     Past Surgical History:  Procedure Laterality Date  . NO PAST SURGERIES      No current outpatient prescriptions on file. No Known Allergies  Social History  Substance Use Topics  . Smoking status: Never Smoker  . Smokeless tobacco: Never Used  . Alcohol use No     Comment:  occasional    Family History  Problem Relation Age of Onset  . Gallbladder disease Mother   . Cancer Father     prostate       Review of Systems  Constitutional: negative for fatigue and weight loss Respiratory: negative for cough and wheezing Cardiovascular: negative for chest pain, fatigue and palpitations Gastrointestinal: negative for abdominal pain and change in bowel habits Musculoskeletal:negative for myalgias Neurological: negative for gait problems and tremors Behavioral/Psych: negative for abusive relationship, depression Endocrine: negative for temperature intolerance   Genitourinary:negative for abnormal menstrual periods, genital lesions, hot flashes, sexual problems and vaginal discharge Integument/breast: negative for breast lump, breast tenderness, nipple discharge and skin lesion(s)    Objective:       BP (!) 132/95   Pulse 62   Wt 170 lb 6.4 oz (77.3 kg)   LMP 03/04/2016   Breastfeeding? No Comment: stopped last month  BMI 31.68 kg/m  General:   alert  Skin:   no rash or abnormalities  Lungs:   clear to auscultation bilaterally  Heart:   regular rate and rhythm, S1, S2 normal, no murmur, click, rub or gallop  Breasts:   normal without suspicious masses, skin or nipple changes or axillary nodes  Abdomen:  normal findings: no organomegaly, soft, non-tender and no hernia  Pelvis:  External genitalia: normal general appearance  Urinary system: urethral meatus normal and bladder without fullness, nontender Vaginal: normal without tenderness, induration or masses Cervix: normal appearance Adnexa: normal bimanual exam Uterus: anteverted and non-tender, normal size   Lab Review Urine pregnancy test Labs reviewed yes Radiologic studies reviewed no  50% of 20 min visit spent on counseling and coordination of care.   Assessment:    Healthy female exam.    Contraceptive counseling and advice.  Wants Depo Provera.   Plan:    Depo Provera Rx  Education  reviewed: calcium supplements, low fat, low cholesterol diet, safe sex/STD prevention, self breast exams and weight bearing exercise. Contraception: Depo-Provera injections. Follow up in: 1 year.   No orders of the defined types were placed in this encounter.  No orders of the defined types were placed in this encounter.

## 2016-03-16 LAB — CYTOLOGY - PAP
DIAGNOSIS: NEGATIVE
HPV: NOT DETECTED

## 2016-03-21 LAB — NUSWAB VG+, CANDIDA 6SP
CANDIDA KRUSEI, NAA: NEGATIVE
CANDIDA LUSITANIAE, NAA: NEGATIVE
CANDIDA PARAPSILOSIS, NAA: NEGATIVE
CANDIDA TROPICALIS, NAA: NEGATIVE
CHLAMYDIA TRACHOMATIS, NAA: NEGATIVE
Candida albicans, NAA: NEGATIVE
Candida glabrata, NAA: NEGATIVE
Neisseria gonorrhoeae, NAA: NEGATIVE
TRICH VAG BY NAA: NEGATIVE

## 2017-02-02 ENCOUNTER — Encounter: Payer: Self-pay | Admitting: Family Medicine

## 2017-04-11 ENCOUNTER — Ambulatory Visit (INDEPENDENT_AMBULATORY_CARE_PROVIDER_SITE_OTHER): Payer: Medicaid Other | Admitting: Obstetrics

## 2017-04-11 ENCOUNTER — Encounter: Payer: Self-pay | Admitting: Obstetrics

## 2017-04-11 ENCOUNTER — Other Ambulatory Visit (HOSPITAL_COMMUNITY)
Admission: RE | Admit: 2017-04-11 | Discharge: 2017-04-11 | Disposition: A | Discharge status: Home / Self Care | Payer: Medicaid Other | Source: Ambulatory Visit | Attending: Obstetrics | Admitting: Obstetrics

## 2017-04-11 VITALS — BP 149/89 | HR 57 | Ht 61.5 in | Wt 177.8 lb

## 2017-04-11 DIAGNOSIS — Z113 Encounter for screening for infections with a predominantly sexual mode of transmission: Secondary | ICD-10-CM | POA: Insufficient documentation

## 2017-04-11 DIAGNOSIS — Z01411 Encounter for gynecological examination (general) (routine) with abnormal findings: Secondary | ICD-10-CM | POA: Diagnosis not present

## 2017-04-11 DIAGNOSIS — R030 Elevated blood-pressure reading, without diagnosis of hypertension: Secondary | ICD-10-CM

## 2017-04-11 DIAGNOSIS — Z1151 Encounter for screening for human papillomavirus (HPV): Secondary | ICD-10-CM | POA: Diagnosis not present

## 2017-04-11 DIAGNOSIS — Z01419 Encounter for gynecological examination (general) (routine) without abnormal findings: Secondary | ICD-10-CM

## 2017-04-11 DIAGNOSIS — Z8379 Family history of other diseases of the digestive system: Secondary | ICD-10-CM | POA: Insufficient documentation

## 2017-04-11 DIAGNOSIS — Z8042 Family history of malignant neoplasm of prostate: Secondary | ICD-10-CM | POA: Insufficient documentation

## 2017-04-11 DIAGNOSIS — N898 Other specified noninflammatory disorders of vagina: Secondary | ICD-10-CM | POA: Diagnosis not present

## 2017-04-11 DIAGNOSIS — Z0000 Encounter for general adult medical examination without abnormal findings: Secondary | ICD-10-CM | POA: Diagnosis not present

## 2017-04-11 NOTE — Progress Notes (Signed)
Subjective:        Jody Bullock is a 36 y.o. female here for a routine exam.  Current complaints: None.    Personal health questionnaire:  Is patient Ashkenazi Jewish, have a family history of breast and/or ovarian cancer: no Is there a family history of uterine cancer diagnosed at age < 3050, gastrointestinal cancer, urinary tract cancer, family member who is a Personnel officerLynch syndrome-associated carrier: no Is the patient overweight and hypertensive, family history of diabetes, personal history of gestational diabetes, preeclampsia or PCOS: no Is patient over 4655, have PCOS,  family history of premature CHD under age 35, diabetes, smoke, have hypertension or peripheral artery disease:  no At any time, has a partner hit, kicked or otherwise hurt or frightened you?: no Over the past 2 weeks, have you felt down, depressed or hopeless?: no Over the past 2 weeks, have you felt little interest or pleasure in doing things?:no   Gynecologic History Patient's last menstrual period was 03/27/2017. Contraception: none Last Pap: 2017. Results were: normal Last mammogram: n/a. Results were: n/a  Obstetric History OB History  Gravida Para Term Preterm AB Living  3 2 1 1 1 2   SAB TAB Ectopic Multiple Live Births  1 0 0 0 2    # Outcome Date GA Lbr Len/2nd Weight Sex Delivery Anes PTL Lv  3 Preterm 09/09/15 5533w4d 08:20 / 00:22 6 lb 10.7 oz (3.025 kg) M Vag-Spont EPI  LIV     Birth Comments: preterm  2 SAB 2014 6950w0d    SAB     1 Term 05/02/09 636w0d  7 lb 6 oz (3.345 kg) M Vag-Spont EPI N LIV      Past Medical History:  Diagnosis Date  . Hypertension   . Medical history non-contributory     Past Surgical History:  Procedure Laterality Date  . NO PAST SURGERIES      No current outpatient medications on file. No Known Allergies  Social History   Tobacco Use  . Smoking status: Never Smoker  . Smokeless tobacco: Never Used  Substance Use Topics  . Alcohol use: No    Comment: occasional     Family History  Problem Relation Age of Onset  . Gallbladder disease Mother   . Cancer Father        prostate       Review of Systems  Constitutional: negative for fatigue and weight loss Respiratory: negative for cough and wheezing Cardiovascular: negative for chest pain, fatigue and palpitations Gastrointestinal: negative for abdominal pain and change in bowel habits Musculoskeletal:negative for myalgias Neurological: negative for gait problems and tremors Behavioral/Psych: negative for abusive relationship, depression Endocrine: negative for temperature intolerance    Genitourinary:negative for abnormal menstrual periods, genital lesions, hot flashes, sexual problems and vaginal discharge Integument/breast: negative for breast lump, breast tenderness, nipple discharge and skin lesion(s)    Objective:       BP (!) 149/89   Pulse (!) 57   Ht 5' 1.5" (1.562 m)   Wt 177 lb 12.8 oz (80.6 kg)   LMP 03/27/2017   Breastfeeding? No   BMI 33.05 kg/m  General:   alert  Skin:   no rash or abnormalities  Lungs:   clear to auscultation bilaterally  Heart:   regular rate and rhythm, S1, S2 normal, no murmur, click, rub or gallop  Breasts:   normal without suspicious masses, skin or nipple changes or axillary nodes  Abdomen:  normal findings: no organomegaly, soft, non-tender  and no hernia  Pelvis:  External genitalia: normal general appearance Urinary system: urethral meatus normal and bladder without fullness, nontender Vaginal: normal without tenderness, induration or masses Cervix: normal appearance Adnexa: normal bimanual exam Uterus: anteverted and non-tender, normal size   Lab Review Urine pregnancy test Labs reviewed yes Radiologic studies reviewed no  50% of 20 min visit spent on counseling and coordination of care.   Assessment:     1. Encounter for gynecological examination with Papanicolaou smear of cervix Rx: - Cytology - PAP  2. Vaginal  discharge Rx: - Cervicovaginal ancillary only  3. Screen for STD (sexually transmitted disease) Rx: - Hepatitis C antibody - Hepatitis B surface antigen - HIV antibody - RPR  4. Elevated BP without diagnosis of hypertension Rx: - Ambulatory referral to Internal Medicine   Plan:    Education reviewed: calcium supplements, depression evaluation, low fat, low cholesterol diet, safe sex/STD prevention, self breast exams and weight bearing exercise. Contraception: condoms. Follow up in: 1 year.   No orders of the defined types were placed in this encounter.  Orders Placed This Encounter  Procedures  . Hepatitis C antibody  . Hepatitis B surface antigen  . HIV antibody  . RPR  . Ambulatory referral to Internal Medicine    Referral Priority:   Routine    Referral Type:   Consultation    Referral Reason:   Specialty Services Required    Requested Specialty:   Internal Medicine    Number of Visits Requested:   1

## 2017-04-11 NOTE — Patient Instructions (Addendum)
What You Need to Know About HPV and Cancer HPV (human papillomavirus)is a virus that is passed easily from person to person through sexual contact. It is the most common STI (sexually transmitted infection). There are many types of HPV. Most people who are infected with HPV do not have symptoms. In some cases, HPV can cause warts or lesions in the genital or throat area. Certain types of HPV can also cause cancer. HPV infections can cause various kinds of cancer, including:  Cervical cancer.  Vaginal cancer.  Vulvar cancer.  Anal cancer.  Throat cancer.  Tongue or mouth cancer.  Penile cancer.  Some HPV infections go away on their own within 1-2 years, but other HPV infections may cause changes in cells that could lead to cancer. You can take steps to avoid HPV infection and to lower your risk of getting cancer. How does HPV spread? HPV spreads easily through sexual contact. You can get HPV from vaginal sex, oral sex, anal sex, or just by touching someone's genitals. Even people who have only one sexual partner may have HPV. HPV often does not cause symptoms, so most infected people do not know that they have it. How can I prevent HPV infection? Take the following steps to help prevent HPV infection:  Talk with your health care provider about getting the HPV vaccine. If you are 24 years old or younger, you may need to have this vaccine. This vaccine protects against the types of HPV that could cause cancer.  Limit the number of people you have sex with. Also avoid having sex with people who have had many sexual partners.  Use a latex condom during sex.  Talk with your sexual partners about their health.  What can I do to lower my risk of cancer? Having a healthy lifestyle and taking some preventive steps can help lower your cancer risk, whether or not you have HPV. Some steps you can take include: Lifestyle  Practice safe sex to help prevent HPV infection.  Do not use any  products that contain nicotine or tobacco, such as cigarettes and e-cigarettes. If you need help quitting, ask your health care provider.  Eat foods that have antioxidants, such as fruits, vegetables, and grains. Try to eat at least 5 servings of fruits and vegetables every day.  Get regular exercise.  Lose weight if you are overweight.  Practice good oral hygiene. This includes flossing and brushing your teeth every day. Other preventive steps  Get the HPV vaccine as told by your health care provider.  Get tested for STIs even if you do not have symptoms of HPV. You may have HPV and not know it.  If you are a woman, get regular Pap tests. Talk with your health care provider about how often you need these tests. Pap tests will help identify changes in cells that can lead to cancer. Where to find support: Ask your health care provider what brochures, flyers, websites, and community resources may be available to you. Where to find more information: Learn more about HPV and cancer from:  Centers for Disease Control and Prevention: http://sweeney-todd.com/  [place]: www.cancer.gov  American Cancer Society: www.cancer.org  Contact a health care provider if:  You have genital warts.  You are sexually active and think you may have HPV.  You did not protect yourself during sex and think you may have HPV. Summary  HPV (human papillomavirus)is a virus that is passed easily from person to person through sexual contact.  Certain types of HPV may cause changes in cells that could lead to cancer.  You should take steps to avoid HPV infection, such as limiting the number of people you have sex with, using condoms during sex, and getting the HPV vaccine.  Lifestyle changes can help lower your risk of cancer. These include eating a healthy diet, getting regular exercise, and not using any products that contain nicotine or tobacco.  You may have HPV and not know it. Get tested for  STIs even if you do not have symptoms of HPV. If you are a woman, have regular Pap tests as directed by your health care provider. This information is not intended to replace advice given to you by your health care provider. Make sure you discuss any questions you have with your health care provider. Document Released: 01/20/2016 Document Revised: 01/20/2016 Document Reviewed: 01/20/2016 Elsevier Interactive Patient Education  2018 [employer]  Genital Warts Genital warts are a common STD (sexually transmitted disease). They may appear as small bumps on the tissues of the genital area or anal area. Sometimes, they can become irritated and cause pain. Genital warts are easily passed to other people through sexual contact. Getting treatment is important because genital warts can lead to other problems. In females, the virus that causes genital warts may increase the risk of cervical cancer. What are the causes? Genital warts are caused by a virus that is called human papillomavirus (HPV). HPV is spread by having unprotected sex with an infected person. It can be spread through vaginal, anal, and oral sex. Many people do not know that they are infected. They may be infected for years without problems. However, even if they do not have problems, they can pass the infection to their sexual partners. What increases the risk? Genital warts are more likely to develop in:  People who have unprotected sex.  People who have multiple sexual partners.  People who become sexually active before they are 35 years of age.  Men who are not circumcised.  Women who have a female sexual partner who is not circumcised.  People who have a weakened body defense system (immune system) due to disease or medicine.  People who smoke.  What are the signs or symptoms? Symptoms of genital warts include:  Small growths in the genital area or anal area. These warts often grow in clusters.  Itching and irritation in  the genital area or anal area.  Bleeding from the warts.  Painful sexual intercourse.  How is this diagnosed? Genital warts can usually be diagnosed from their appearance on the vagina, vulva, penis, perineum, anus, or rectum. Tests may also be done, such as:  Biopsy. A tissue sample is removed so it can be looked at under a microscope.  Colposcopy. In females, a magnifying tool is used to examine the vagina and cervix. Certain solutions may be used to make the HPV cells change color so they can be seen more easily.  A Pap test in females.  Tests for other STDs.  How is this treated? Treatment for genital warts may include:  Applying prescription medicines to the warts. These may be solutions or creams.  Freezing the warts with liquid nitrogen (cryotherapy).  Burning the warts with: ? Laser treatment. ? An electrified probe (electrocautery).  Injecting a substance (Candida antigen or Trichophyton antigen) into the warts to help the body's immune system to fight off the warts.  Interferon injections.  Surgery to remove the warts.  Follow these instructions  at home: Medicines  Apply over-the-counter and prescription medicines only as told by your health care provider.  Do not treat genital warts with medicines that are used for treating hand warts.  Talk with your health care provider about using over-the-counter anti-itch creams. General instructions  Do not touch or scratch the warts.  Do not have sex until your treatment has been completed.  Tell your current and past sexual partners about your condition because they may also need treatment.  Keep all follow-up visits as told by your health care provider. This is important.  After treatment, use condoms during sex to prevent future infections. Other Instructions for Women  Women who have genital warts might need increased screening for cervical cancer. This type of cancer is slow growing and can be cured if it  is found early. Chances of developing cervical cancer are increased with HPV.  If you become pregnant, tell your health care provider that you have had HPV. Your health care provider will monitor you closely during pregnancy to be sure that your baby is safe. How is this prevented? Talk with your health care provider about getting the HPV vaccines. These vaccines prevent some HPV infections and cancers. It is recommended that the vaccine be given to males and females who are 559-35 years of age. It will not work if you already have HPV, and it is not recommended for pregnant women. Contact a health care provider if:  You have redness, swelling, or pain in the area of the treated skin.  You have a fever.  You feel generally ill.  You feel lumps in and around your genital area or anal area.  You have bleeding in your genital area or anal area.  You have pain during sexual intercourse. This information is not intended to replace advice given to you by your health care provider. Make sure you discuss any questions you have with your health care provider. Document Released: 04/28/2000 Document Revised: 10/07/2015 Document Reviewed: 07/27/2014 Elsevier Interactive Patient Education  [employer]2018 Elsevier Inc.

## 2017-04-12 LAB — CERVICOVAGINAL ANCILLARY ONLY
Bacterial vaginitis: NEGATIVE
CANDIDA VAGINITIS: NEGATIVE
CHLAMYDIA, DNA PROBE: NEGATIVE
NEISSERIA GONORRHEA: NEGATIVE
Trichomonas: NEGATIVE

## 2017-04-12 LAB — HEPATITIS C ANTIBODY: Hep C Virus Ab: <0.1 s/co ratio (ref 0.0–0.9)

## 2017-04-12 LAB — HIV ANTIBODY (ROUTINE TESTING W REFLEX): HIV Screen 4th Generation wRfx: NONREACTIVE

## 2017-04-12 LAB — RPR: RPR: NONREACTIVE

## 2017-04-12 LAB — HEPATITIS B SURFACE ANTIGEN: Hepatitis B Surface Ag: NEGATIVE

## 2017-04-16 LAB — CYTOLOGY - PAP
Diagnosis: NEGATIVE
HPV (WINDOPATH): NOT DETECTED

## 2017-11-04 IMAGING — US US MFM OB LIMITED
1 series · 13 of 15 positions shown · non-contrast
Comparison: none

[Series 1: us mfm ob limited · 0.22mm/px · 13 of 15 slices shown]
[im 1/15]
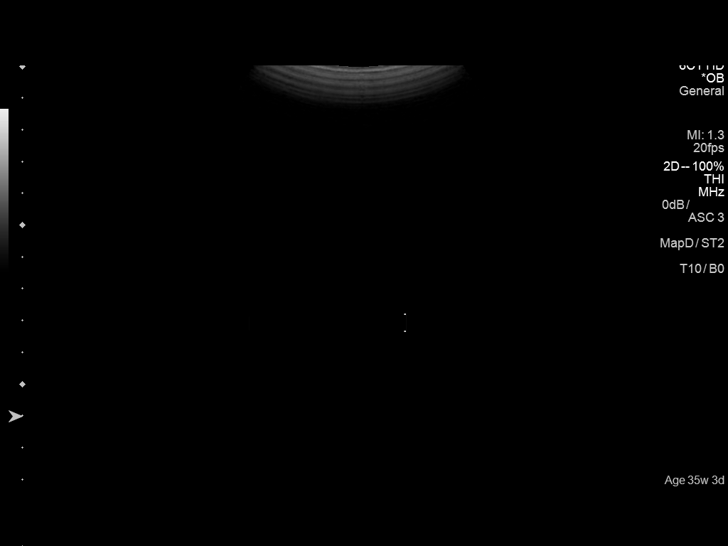
[im 2/15]
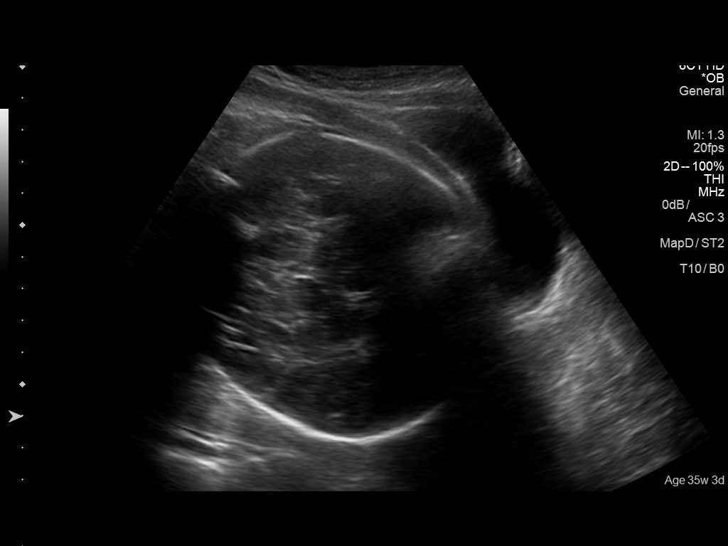
[im 3/15]
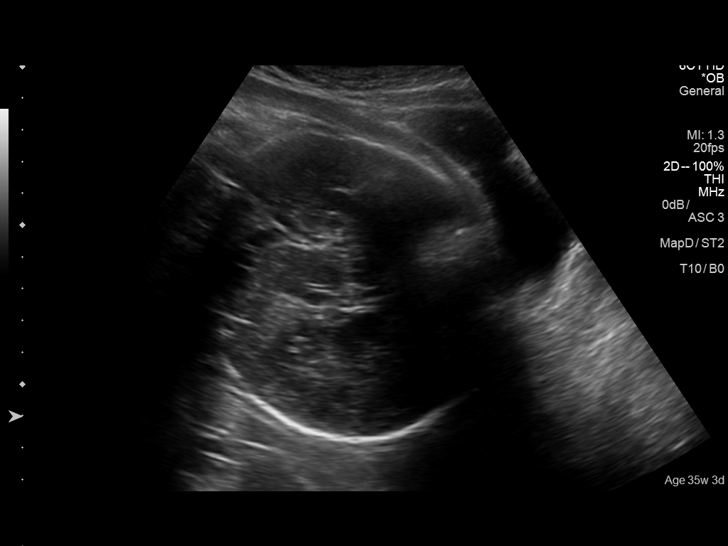
[im 5/15]
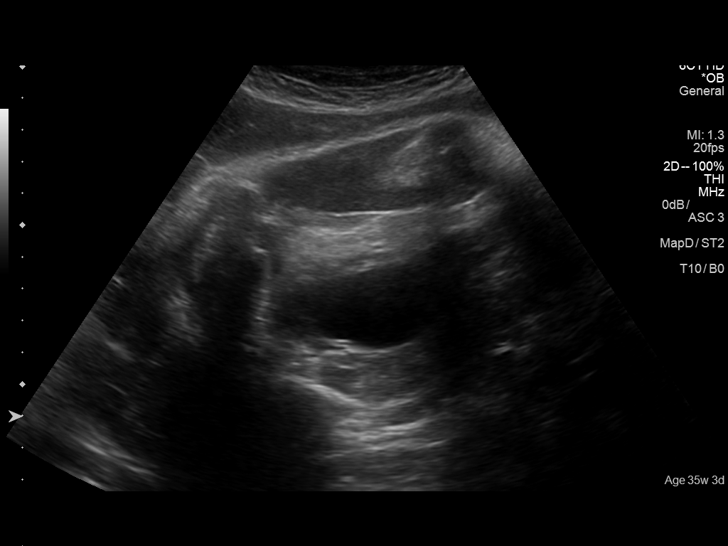
[im 6/15]
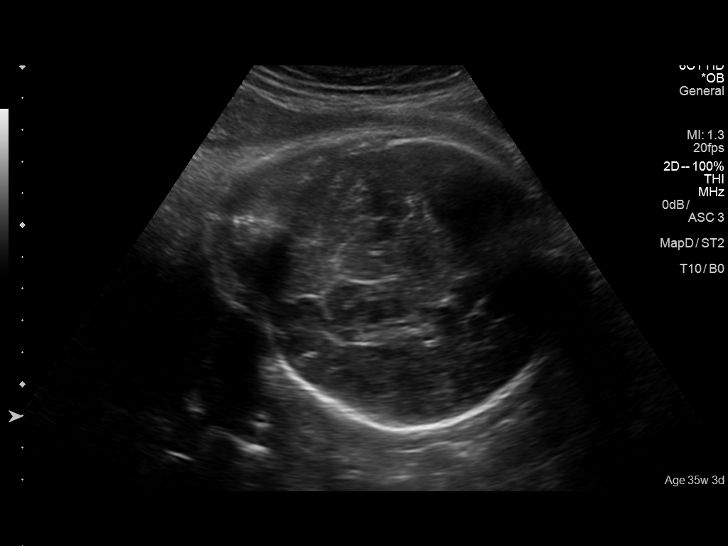
[im 7/15]
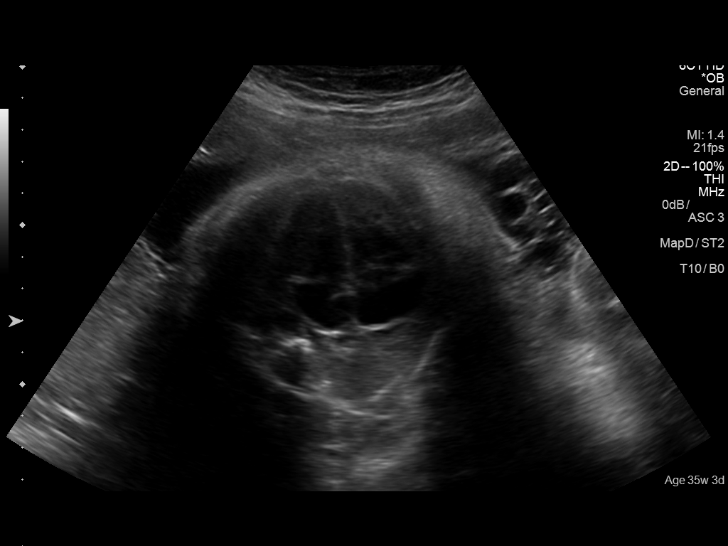
[im 8/15]
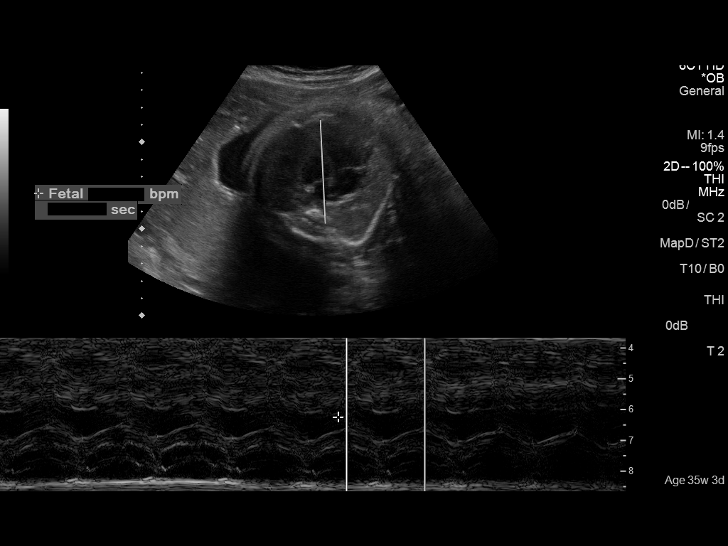
[im 9/15]
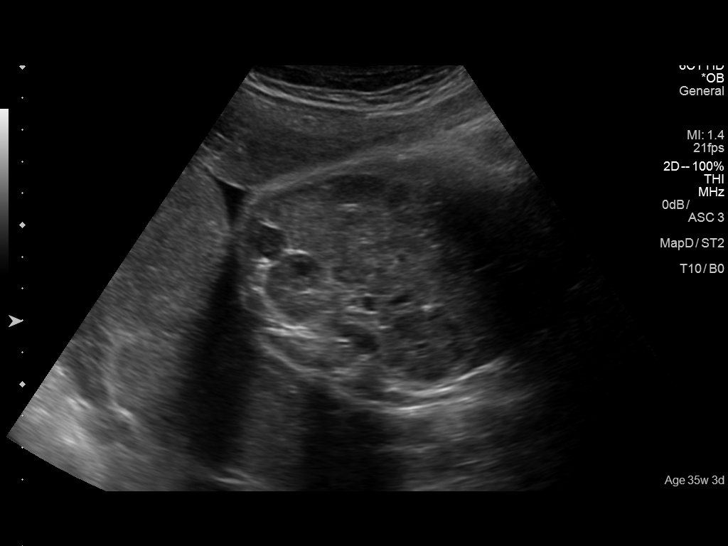
[im 10/15]
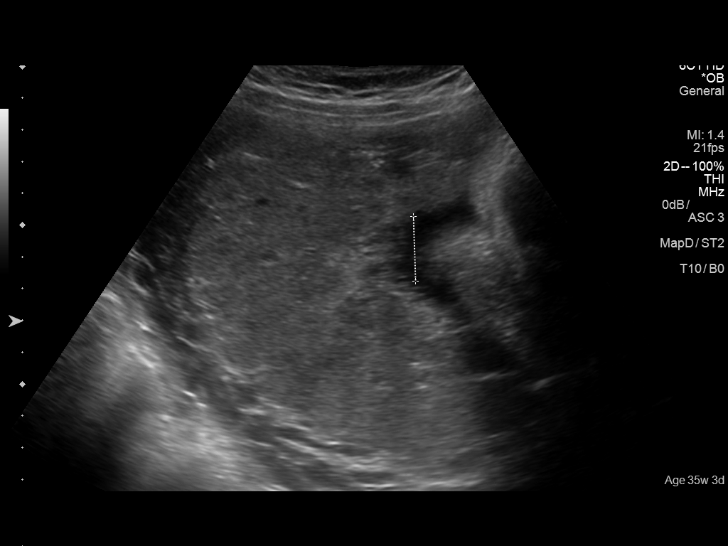
[im 11/15]
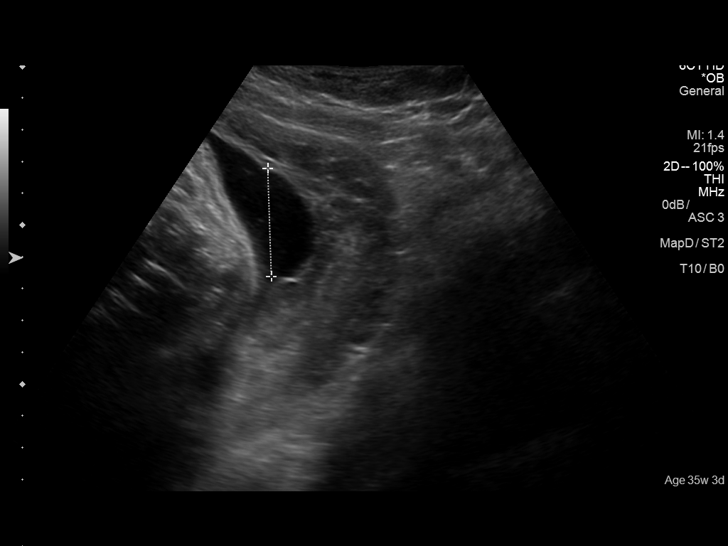
[im 13/15]
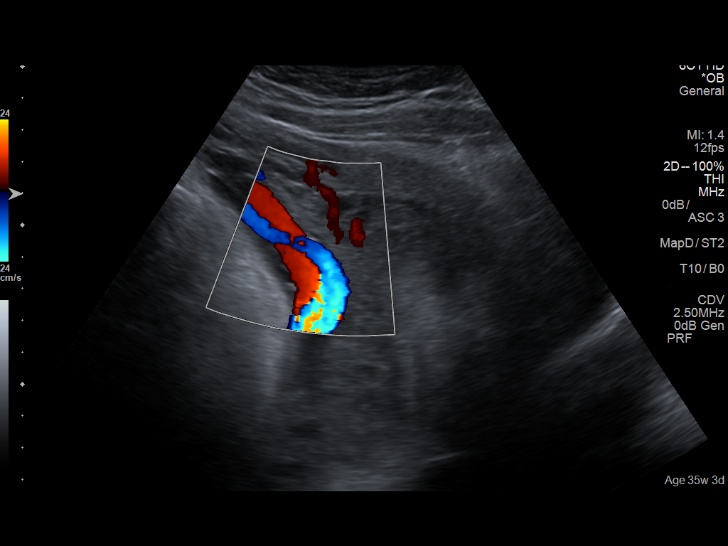
[im 14/15]
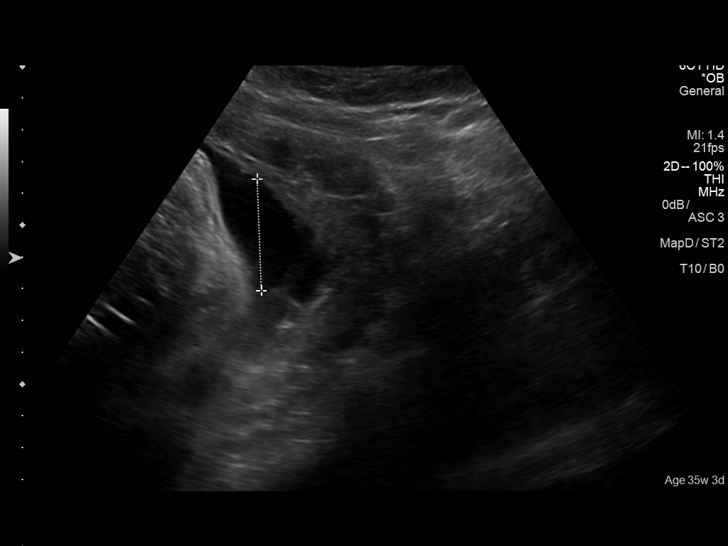
[im 15/15]
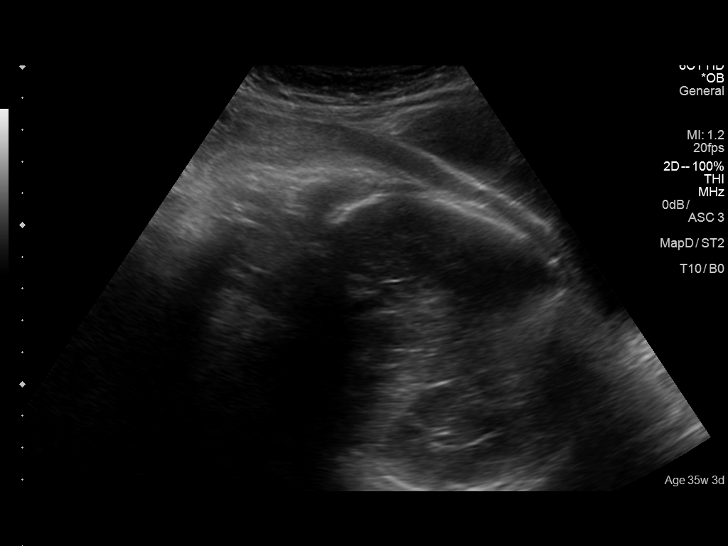

[13 of 15 positions shown; findings below may reference images not displayed]

Road [HOSPITAL]

Indications

35 weeks gestation of pregnancy
Premature rupture of membranes - leaking
fluid
Determine fetal presentation using             Z36
ultrasound
OB History

Gravidity:    3         Term:   1         SAB:   1
Living:       1
Fetal Evaluation

Num Of Fetuses:     1
Fetal Heart         134
Rate(bpm):
Cardiac Activity:   Observed
Presentation:       Cephalic

Amniotic Fluid
AFI FV:      Subjectively decreased

AFI Sum(cm)     %Tile       Largest Pocket(cm)
8.45            10

RUQ(cm)       RLQ(cm)       LUQ(cm)
2.04
Gestational Age
LMP:           35w 3d       Date:   01/03/15                 EDD:   10/10/15
Best:          35w 3d    Det. By:   LMP  (01/03/15)          EDD:   10/10/15
Anatomy

Ventricles:            Appears normal         Kidneys:                Appear normal
Heart:                 Appears normal         Bladder:                Appears normal
(4CH, axis, and
situs)
Stomach:               Appears normal, left
sided
Impression

Single IUP at 35w 3d
Limited ultrasound performed to determine presentation
Cephalic presentation
Amniotic fluid volume subjectively decreased (AFI 8.5 cm)
Recommendations

Follow-up ultrasounds as clinically indicated.

## 2018-12-16 ENCOUNTER — Ambulatory Visit: Payer: Self-pay | Admitting: Internal Medicine

## 2019-01-01 ENCOUNTER — Ambulatory Visit: Payer: Self-pay | Admitting: Internal Medicine

## 2019-02-05 ENCOUNTER — Ambulatory Visit: Payer: Medicaid Other | Admitting: Internal Medicine

## 2019-02-05 ENCOUNTER — Encounter: Payer: Self-pay | Admitting: Internal Medicine

## 2019-02-05 ENCOUNTER — Ambulatory Visit: Payer: Self-pay | Admitting: Internal Medicine

## 2019-02-05 ENCOUNTER — Other Ambulatory Visit: Payer: Self-pay

## 2019-02-05 VITALS — BP 128/78 | HR 82 | Resp 12 | Ht 60.5 in | Wt 177.0 lb

## 2019-02-05 DIAGNOSIS — I10 Essential (primary) hypertension: Secondary | ICD-10-CM | POA: Insufficient documentation

## 2019-02-05 DIAGNOSIS — J302 Other seasonal allergic rhinitis: Secondary | ICD-10-CM | POA: Insufficient documentation

## 2019-02-05 DIAGNOSIS — E6609 Other obesity due to excess calories: Secondary | ICD-10-CM | POA: Diagnosis not present

## 2019-02-05 DIAGNOSIS — L309 Dermatitis, unspecified: Secondary | ICD-10-CM

## 2019-02-05 DIAGNOSIS — Z6834 Body mass index (BMI) 34.0-34.9, adult: Secondary | ICD-10-CM

## 2019-02-05 DIAGNOSIS — E66811 Obesity, class 1: Secondary | ICD-10-CM

## 2019-02-05 MED ORDER — LORATADINE 10 MG PO TABS: 10.0000 mg | ORAL_TABLET | Freq: Every day | ORAL | 11 refills | Status: DC

## 2019-02-05 MED ORDER — TRIAMCINOLONE ACETONIDE 0.1 % EX CREA: TOPICAL_CREAM | CUTANEOUS | 0 refills | Status: DC

## 2019-02-05 NOTE — Patient Instructions (Signed)

## 2019-02-05 NOTE — Progress Notes (Signed)
Subjective:    Patient ID: Jody Bullock, female   DOB: Feb 10, 1981, 38 y.o.   MRN: [id number]   HPI   Here to establish  No real concerns.  Just wants to get a good check up.  Weight concerns:  Does not eat in a healthy way.  Drinks soda daily and "tropical smoothies" daily.  Summer--nasal congestion.  Not really a problem now.  Pruritic dry rash on right lateral ankle currently and between fingers when washes hands frequently.  No outpatient medications have been marked as taking for the 02/05/19 encounter (Office Visit) with Julieanne Manson, MD.    No Known Allergies   Past Medical History:  Diagnosis Date  . Hypertension    Pre eclampsia after birth of son in 2017  . Medical history non-contributory     Past Surgical History:  Procedure Laterality Date  . NO PAST SURGERIES     Family History  Problem Relation Age of Onset  . Gallbladder disease Mother   . Cancer Mother        Cervical cancer--total hysterectomy.  . Cancer Father        prostate   . Anxiety disorder Father   . Heart disease Father   . Hyperlipidemia Father   . Heart disease Brother        Died of AMI   Social History   Socioeconomic History  . Marital status: Single    Spouse name: Not on file  . Number of children: Not on file  . Years of education: Not on file  . Highest education level: Not on file  Occupational History  . Not on file  Social Needs  . Financial resource strain: Not hard at all  . Food insecurity    Worry: Never true    Inability: Never true  . Transportation needs    Medical: No    Non-medical: No  Tobacco Use  . Smoking status: Never Smoker  . Smokeless tobacco: Never Used  Substance and Sexual Activity  . Alcohol use: No    Comment: occasional  . Drug use: Yes    Types: Marijuana    Comment: daily  . Sexual activity: Not Currently    Partners: Male  Lifestyle  . Physical activity    Days per week: 7 days    Minutes per session: Not on  file  . Stress: Not on file  Relationships  . Social Musician on phone: Not on file    Gets together: Not on file    Attends religious service: Not on file    Active member of club or organization: Not on file    Attends meetings of clubs or organizations: Not on file    Relationship status: Never married  . Intimate partner violence    Fear of current or ex partner: No    Emotionally abused: No    Physically abused: No    Forced sexual activity: No  Other Topics Concern  . Not on file  Social History Narrative   Lives at home with 2 sons.   Father of sons involved--6 years younger and a good dad.     He is supportive.      Review of Systems    Objective:   Blood pressure 128/78, pulse 82, resp. rate 12, height 5' 0.5" (1.537 m), weight 177 lb (80.3 kg), last menstrual period 01/27/2019.  Physical Exam  NAD HEENT: PERRL, EOMI, Neck:  Supple, no adenopathy Chest:  CTA CV:  RRR with normal S1 and S2, No S3, S4 or murmur.  Radial and DP pulses normal and equal. Abd:  S NT, No HSM or mass, + BS LE:  No edema. Skin:  Right lateral ankle below malleolus with thickening, flaking and lichenification.   Assessment & Plan   1.  Obesity:  Discussed improved diet and goal making.  Continue daily 2 mile walks. Fasting labs in next 2 weeks:  FLP, CBC, CMP  2.  Seasonal Allergies:  Loratadine 10 mg daily as needed.  No symptoms currently  3.  Mild eczema:  Present only on right ankle currently.  Triamcinolone cream 0.1% to apply twice daily as needed followed by hydrating cream all over.

## 2019-02-25 ENCOUNTER — Other Ambulatory Visit: Payer: Medicaid Other

## 2019-03-11 ENCOUNTER — Other Ambulatory Visit (INDEPENDENT_AMBULATORY_CARE_PROVIDER_SITE_OTHER): Payer: Medicaid Other

## 2019-03-11 ENCOUNTER — Other Ambulatory Visit: Payer: Self-pay

## 2019-03-11 DIAGNOSIS — Z79899 Other long term (current) drug therapy: Secondary | ICD-10-CM

## 2019-03-11 DIAGNOSIS — Z13220 Encounter for screening for lipoid disorders: Secondary | ICD-10-CM

## 2019-03-12 LAB — CBC WITH DIFFERENTIAL/PLATELET
Basophils Absolute: 0.1 10*3/uL (ref 0.0–0.2)
Basos: 1 %
EOS (ABSOLUTE): 0.1 10*3/uL (ref 0.0–0.4)
Eos: 3 %
Hematocrit: 36.9 % (ref 34.0–46.6)
Hemoglobin: 11.4 g/dL (ref 11.1–15.9)
Immature Grans (Abs): 0 10*3/uL (ref 0.0–0.1)
Immature Granulocytes: 0 %
Lymphocytes Absolute: 1.8 10*3/uL (ref 0.7–3.1)
Lymphs: 32 %
MCH: 28.3 pg (ref 26.6–33.0)
MCHC: 30.9 g/dL — ABNORMAL LOW (ref 31.5–35.7)
MCV: 92 fL (ref 79–97)
Monocytes Absolute: 0.4 10*3/uL (ref 0.1–0.9)
Monocytes: 7 %
Neutrophils Absolute: 3.2 10*3/uL (ref 1.4–7.0)
Neutrophils: 57 %
Platelets: 185 10*3/uL (ref 150–450)
RBC: 4.03 x10E6/uL (ref 3.77–5.28)
RDW: 14 % (ref 11.7–15.4)
WBC: 5.6 10*3/uL (ref 3.4–10.8)

## 2019-03-12 LAB — COMPREHENSIVE METABOLIC PANEL
ALT: 8 IU/L (ref 0–32)
AST: 15 IU/L (ref 0–40)
Albumin/Globulin Ratio: 1.7 (ref 1.2–2.2)
Albumin: 4.2 g/dL (ref 3.8–4.8)
Alkaline Phosphatase: 55 IU/L (ref 39–117)
BUN/Creatinine Ratio: 15 (ref 9–23)
BUN: 11 mg/dL (ref 6–20)
Bilirubin Total: 0.3 mg/dL (ref 0.0–1.2)
CO2: 24 mmol/L (ref 20–29)
Calcium: 8.9 mg/dL (ref 8.7–10.2)
Chloride: 107 mmol/L — ABNORMAL HIGH (ref 96–106)
Creatinine, Ser: 0.74 mg/dL (ref 0.57–1.00)
GFR calc Af Amer: 120 mL/min/{1.73_m2} (ref >59)
GFR calc non Af Amer: 104 mL/min/{1.73_m2} (ref >59)
Globulin, Total: 2.5 g/dL (ref 1.5–4.5)
Glucose: 80 mg/dL (ref 65–99)
Potassium: 4.6 mmol/L (ref 3.5–5.2)
Sodium: 139 mmol/L (ref 134–144)
Total Protein: 6.7 g/dL (ref 6.0–8.5)

## 2019-03-12 LAB — LIPID PANEL W/O CHOL/HDL RATIO
Cholesterol, Total: 155 mg/dL (ref 100–199)
HDL: 65 mg/dL (ref >39)
LDL Chol Calc (NIH): 77 mg/dL (ref 0–99)
Triglycerides: 63 mg/dL (ref 0–149)
VLDL Cholesterol Cal: 13 mg/dL (ref 5–40)

## 2019-03-25 ENCOUNTER — Other Ambulatory Visit: Payer: Self-pay

## 2019-03-25 ENCOUNTER — Ambulatory Visit (INDEPENDENT_AMBULATORY_CARE_PROVIDER_SITE_OTHER): Payer: Medicaid Other

## 2019-03-25 DIAGNOSIS — Z23 Encounter for immunization: Secondary | ICD-10-CM

## 2019-04-18 ENCOUNTER — Other Ambulatory Visit: Payer: Self-pay

## 2019-04-18 DIAGNOSIS — Z20822 Contact with and (suspected) exposure to covid-19: Secondary | ICD-10-CM

## 2019-04-19 LAB — NOVEL CORONAVIRUS, NAA: SARS-CoV-2, NAA: NOT DETECTED

## 2019-05-27 ENCOUNTER — Ambulatory Visit: Payer: Medicaid Other | Attending: Internal Medicine

## 2019-05-27 DIAGNOSIS — Z20822 Contact with and (suspected) exposure to covid-19: Secondary | ICD-10-CM

## 2019-05-28 LAB — NOVEL CORONAVIRUS, NAA: SARS-CoV-2, NAA: NOT DETECTED

## 2019-06-06 ENCOUNTER — Encounter: Payer: Self-pay | Admitting: Internal Medicine

## 2019-06-06 ENCOUNTER — Encounter: Payer: Medicaid Other | Admitting: Internal Medicine

## 2019-06-06 ENCOUNTER — Ambulatory Visit: Payer: Medicaid Other | Admitting: Internal Medicine

## 2019-06-06 ENCOUNTER — Other Ambulatory Visit: Payer: Self-pay

## 2019-06-06 VITALS — BP 132/80 | HR 86 | Resp 12 | Ht 60.5 in | Wt 175.0 lb

## 2019-06-06 DIAGNOSIS — E6609 Other obesity due to excess calories: Secondary | ICD-10-CM | POA: Diagnosis not present

## 2019-06-06 DIAGNOSIS — Z9109 Other allergy status, other than to drugs and biological substances: Secondary | ICD-10-CM

## 2019-06-06 DIAGNOSIS — Z6834 Body mass index (BMI) 34.0-34.9, adult: Secondary | ICD-10-CM

## 2019-06-06 DIAGNOSIS — L309 Dermatitis, unspecified: Secondary | ICD-10-CM

## 2019-06-06 DIAGNOSIS — Z0000 Encounter for general adult medical examination without abnormal findings: Secondary | ICD-10-CM

## 2019-06-06 DIAGNOSIS — J3089 Other allergic rhinitis: Secondary | ICD-10-CM | POA: Insufficient documentation

## 2019-06-06 MED ORDER — TRIAMCINOLONE ACETONIDE 0.1 % EX CREA: TOPICAL_CREAM | CUTANEOUS | 0 refills | Status: DC

## 2019-06-06 MED ORDER — LORATADINE 10 MG PO TABS: 10.0000 mg | ORAL_TABLET | Freq: Every day | ORAL | 11 refills | Status: DC

## 2019-06-06 NOTE — Progress Notes (Signed)
Subjective:    Patient ID: Jody Bullock, female   DOB: December 17, 1980, 39 y.o.   MRN: [id number]   HPI   CPE without pap  1.  Pap:  Last 04/11/2017 and normal.  Not due until after end of the year.  2.  Mammogram:  Never.     3.  Osteoprevention:  Not a daily Writer.  States very physically active.  Will be out with lawncare shortly as well.  No family history of osteoporosis.  4.  Guaiac Cards:  Never.    5.  Colonoscopy:  Never.  No family history of colon cancer.  6.  Immunizations:   Immunization History  Administered Date(s) Administered  . Hpv 10/29/2007, 12/23/2007, 04/29/2008  . Influenza Inj Mdck Quad Pf 03/25/2019  . Influenza,inj,Quad PF,6+ Mos 03/03/2013  . Td 10/29/2007  . Tdap 09/10/2015     7.  Glucose/Cholesterol:  Both good in October  No outpatient medications have been marked as taking for the 06/06/19 encounter (Office Visit) with Julieanne Manson, MD.   No Known Allergies  Past Medical History:  Diagnosis Date  . Eczema   . Environmental allergies   . Hypertension    Pre eclampsia after birth of son in 2017     Past Surgical History:  Procedure Laterality Date  . NO PAST SURGERIES      Family History  Problem Relation Age of Onset  . Gallbladder disease Mother   . Cancer Mother        Cervical cancer--total hysterectomy.  . Cancer Father        prostate   . Anxiety disorder Father   . Heart disease Father   . Hyperlipidemia Father   . Heart disease Brother        Died of AMI   Social History   Socioeconomic History  . Marital status: Single    Spouse name: Not on file  . Number of children: Not on file  . Years of education: Not on file  . Highest education level: Not on file  Occupational History  . Not on file  Tobacco Use  . Smoking status: Never Smoker  . Smokeless tobacco: Never Used  Substance and Sexual Activity  . Alcohol use: Yes    Alcohol/week: 7.0 standard drinks    Types: 7 Glasses of wine  per week  . Drug use: Yes    Types: Marijuana    Comment: daily  . Sexual activity: Not Currently    Partners: Male  Other Topics Concern  . Not on file  Social History Narrative   Lives at home with 2 sons.   Father of sons involved--6 years younger and a good dad.     He is supportive.   Social Determinants of Health   Financial Resource Strain: Low Risk   . Difficulty of Paying Living Expenses: Not hard at all  Food Insecurity: No Food Insecurity  . Worried About Programme researcher, broadcasting/film/video in the Last Year: Never true  . Ran Out of Food in the Last Year: Never true  Transportation Needs: No Transportation Needs  . Lack of Transportation (Medical): No  . Lack of Transportation (Non-Medical): No  Physical Activity: Unknown  . Days of Exercise per Week: 7 days  . Minutes of Exercise per Session: Not on file  Stress:   . Feeling of Stress :   Social Connections: Unknown  . Frequency of Communication with Friends and Family: Not on file  . Frequency  of Social Gatherings with Friends and Family: Not on file  . Attends Religious Services: Not on file  . Active Member of Clubs or Organizations: Not on file  . Attends Banker Meetings: Not on file  . Marital Status: Never married  Intimate Partner Violence: Not At Risk  . Fear of Current or Ex-Partner: No  . Emotionally Abused: No  . Physically Abused: No  . Sexually Abused: No      ROS Good appetite. Overwhelmed with home school and being a single parent.  No depression or anxiety.  No suicidal ideation. Vision and hearing fine.  No CP, Dyspnea.  + sinus pressure with indoor allergies.  + carpet.  No mattress or pillow covers.   No abdominal complaints + cramping with periods first day only.  Better with ibuprofen. No neurologic complaints Eczema of ankles better controlled.  Objective:   BP 132/80 (BP Location: Left Arm, Patient Position: Sitting, Cuff Size: Normal)   Pulse 86   Resp 12   Ht 5' 0.5" (1.537  m)   Wt 175 lb (79.4 kg)   LMP 06/01/2019   BMI 33.61 kg/m   Physical Exam  Constitutional: She is oriented to person, place, and time. She appears well-developed and well-nourished.  HENT:  Head: Normocephalic and atraumatic.  Right Ear: Hearing, tympanic membrane, external ear and ear canal normal.  Left Ear: Hearing, tympanic membrane, external ear and ear canal normal.  Nose: Mucosal edema and rhinorrhea (Clear) present.  Mouth/Throat: Uvula is midline, oropharynx is clear and moist and mucous membranes are normal.  Eyes: Pupils are equal, round, and reactive to light. Conjunctivae and EOM are normal.  Neck: No thyromegaly present.  Cardiovascular: Normal rate, regular rhythm, S1 normal and S2 normal. Exam reveals no S3, no S4 and no friction rub.  No murmur heard. No carotid bruits.  Carotid, radial, femoral, DP and PT pulses normal and equal.   Pulmonary/Chest: Effort normal and breath sounds normal. Right breast exhibits no inverted nipple, no mass, no nipple discharge and no tenderness. Left breast exhibits no inverted nipple, no mass, no nipple discharge and no tenderness.  Abdominal: Soft. Bowel sounds are normal. She exhibits no mass. There is no hepatosplenomegaly. There is no abdominal tenderness. No hernia.  Genitourinary:    Genitourinary Comments: Normal external female genitalia No vaginal discharge. No uterine or adnexal mass or tenderness.    Musculoskeletal:        General: Normal range of motion.     Cervical back: Full passive range of motion without pain, normal range of motion and neck supple.  Lymphadenopathy:       Head (right side): No submental and no submandibular adenopathy present.       Head (left side): No submental and no submandibular adenopathy present.    She has no cervical adenopathy.    She has no axillary adenopathy.       Right: No inguinal and no supraclavicular adenopathy present.       Left: No inguinal and no supraclavicular adenopathy  present.  Neurological: She is alert and oriented to person, place, and time. She has normal strength and normal reflexes. No cranial nerve deficit or sensory deficit. Coordination and gait normal.  Skin: Skin is warm. Rash (Right lateral ankle with mild flaking/thickening of skin.) noted.  Psychiatric: She has a normal mood and affect. Her speech is normal and behavior is normal. Judgment and thought content normal. Cognition and memory are normal.  Assessment & Plan  1.  CPE without pap Encouraged yearly flu vaccine and COVID when comes available for her   2.  Allergies:  Loratadine and avoidance as in patient information.  3.  Eczema, right ankle and interdigital/hands:  Triamcinolone cream and Eucerin Cream for eczema relief.

## 2019-06-06 NOTE — Patient Instructions (Signed)
Hypoallergenic pillow and mattress covers for allergies--check out [employer] or [employer]--wipe off each week when washing sheets and blankets

## 2019-06-23 ENCOUNTER — Ambulatory Visit: Payer: Medicaid Other | Attending: Internal Medicine

## 2019-06-23 DIAGNOSIS — Z20822 Contact with and (suspected) exposure to covid-19: Secondary | ICD-10-CM

## 2019-06-24 LAB — NOVEL CORONAVIRUS, NAA: SARS-CoV-2, NAA: NOT DETECTED

## 2019-08-01 ENCOUNTER — Ambulatory Visit: Payer: Medicaid Other | Attending: Internal Medicine

## 2019-08-01 DIAGNOSIS — Z23 Encounter for immunization: Secondary | ICD-10-CM

## 2019-08-01 NOTE — Progress Notes (Signed)
   Covid-19 Vaccination Clinic  Name:  Jody Bullock    MRN: [id number] DOB: Sep 28, 1980  08/01/2019  [name] was observed post Covid-19 immunization for 15 minutes without incident. She was provided with Vaccine Information Sheet and instruction to access the V-Safe system.   [name] was instructed to call 911 with any severe reactions post vaccine: Marland Kitchen Difficulty breathing  . Swelling of face and throat  . A fast heartbeat  . A bad rash all over body  . Dizziness and weakness   Immunizations Administered    Name Date Dose VIS Date Route   Pfizer COVID-19 Vaccine 08/01/2019 11:30 AM 0.3 mL 04/25/2019 Intramuscular   Manufacturer: [employer], [employer]   Lot: [id number]   NDC: [address]-3

## 2019-08-26 ENCOUNTER — Ambulatory Visit: Payer: Medicaid Other | Attending: Internal Medicine

## 2019-08-26 DIAGNOSIS — Z23 Encounter for immunization: Secondary | ICD-10-CM

## 2019-08-26 NOTE — Progress Notes (Signed)
   Covid-19 Vaccination Clinic  Name:  ITHZEL FEDORCHAK    MRN: [id number] DOB: 12-Jan-1981  08/26/2019  Ms. Mcclatchey was observed post Covid-19 immunization for 15 minutes without incident. She was provided with Vaccine Information Sheet and instruction to access the V-Safe system.   Ms. Gadberry was instructed to call 911 with any severe reactions post vaccine: Marland Kitchen Difficulty breathing  . Swelling of face and throat  . A fast heartbeat  . A bad rash all over body  . Dizziness and weakness   Immunizations Administered    Name Date Dose VIS Date Route   Pfizer COVID-19 Vaccine 08/26/2019 12:22 PM 0.3 mL 04/25/2019 Intramuscular   Manufacturer: [employer], [employer]   Lot: [id number]   NDC: [address]-0

## 2020-04-23 ENCOUNTER — Telehealth: Payer: Self-pay | Admitting: Internal Medicine

## 2020-04-23 MED ORDER — TRIAMCINOLONE ACETONIDE 0.1 % EX CREA: TOPICAL_CREAM | CUTANEOUS | 0 refills | Status: DC

## 2020-04-23 NOTE — Telephone Encounter (Signed)
We reschedule patient appointment from 04/29/2020 to 07/14/2020. Patient is asking if Doctor can refill triamcinolone cream (Kenalog) 0.01 % because she is having problems with her eczema on the right foot. Patient states that she has rash and itchy.

## 2020-04-23 NOTE — Addendum Note (Signed)
Addended by: Marcene Duos on: 04/23/2020 05:22 PM   Modules accepted: Orders

## 2020-04-27 ENCOUNTER — Ambulatory Visit: Payer: Medicaid Other | Admitting: Internal Medicine

## 2020-04-27 NOTE — Telephone Encounter (Signed)
LVM informing patient of refill.

## 2020-07-14 ENCOUNTER — Other Ambulatory Visit: Payer: Self-pay

## 2020-07-14 ENCOUNTER — Encounter: Payer: Self-pay | Admitting: Internal Medicine

## 2020-07-14 ENCOUNTER — Ambulatory Visit: Payer: Medicaid Other | Admitting: Internal Medicine

## 2020-07-14 VITALS — BP 140/90 | HR 60 | Resp 12 | Ht 61.0 in | Wt 184.0 lb

## 2020-07-14 DIAGNOSIS — Z9109 Other allergy status, other than to drugs and biological substances: Secondary | ICD-10-CM

## 2020-07-14 DIAGNOSIS — J302 Other seasonal allergic rhinitis: Secondary | ICD-10-CM | POA: Diagnosis not present

## 2020-07-14 DIAGNOSIS — F439 Reaction to severe stress, unspecified: Secondary | ICD-10-CM

## 2020-07-14 MED ORDER — FLUTICASONE PROPIONATE 50 MCG/ACT NA SUSP: 2.0000 | Freq: Every day | NASAL | 11 refills | Status: DC

## 2020-07-14 NOTE — Progress Notes (Signed)
    Subjective:    Patient ID: Jody Bullock, female   DOB: [birth year], 40 y.o.   MRN: [id number]   HPI  Here after more than 1 year hiatus.    1.  Nasal and maxillary sinus congestion all the time.  Mild posterior pharyngeal drainage.  Tylenol severe sinus with Tylenol, Guaifenesin, and phenylephrine helps for about 2 hours.  Has been using intermittently a nasal spray.   Really only a problem in the spring, she is recognizing  2.  Elevated BP:   Lot of stress.  She is interested in speaking with Danton Clap, LCSW to help her with her ex and co parenting.    No outpatient medications have been marked as taking for the 07/14/20 encounter (Office Visit) with Julieanne Manson, MD.   No Known Allergies   Review of Systems    Objective:   BP (!) 157/88 (BP Location: Left Arm, Patient Position: Sitting, Cuff Size: Normal)   Pulse 60   Resp 12   Ht 5\' 1"  (1.549 m)   Wt 184 lb (83.5 kg)   LMP 07/06/2020 (Approximate)   BMI 34.77 kg/m   Physical Exam NAD HEENT:  PERRL, EOMI, conjunctivae without injection.  TMs pearly gray, nasal mucosa somewhat swollen with clear discharge.  Cobbling of posterior pharynx. Neck:  Supple, No adenopathy, not thyromegaly Chest:  CTA CV:  RRR without murmur or rub.  Radial and DP pulses normal and equal LE:  No edema.  Assessment & Plan   Seasonal Allergies:  Neti pot, fluticasone.  Consider Loratadine if does not adequately control.  2.  Stress with elevated BP:  Will ask 07/08/2020, LCSW to see.  BP check in 4 weeks.    CPE in 4 months.

## 2020-07-14 NOTE — Patient Instructions (Addendum)
Daily neti pot.   Use the neti pot with saline water first Then Fluticasone nasal spray

## 2020-07-28 ENCOUNTER — Other Ambulatory Visit: Payer: Self-pay | Admitting: Clinical

## 2020-07-28 ENCOUNTER — Telehealth: Payer: Self-pay | Admitting: Clinical

## 2020-07-28 NOTE — Telephone Encounter (Signed)
MSW Intern Jody Bullock called the patient to reschedule her appointment time after receiving a request to do so.

## 2020-08-12 ENCOUNTER — Ambulatory Visit (INDEPENDENT_AMBULATORY_CARE_PROVIDER_SITE_OTHER): Payer: Medicaid Other | Admitting: Internal Medicine

## 2020-08-12 ENCOUNTER — Other Ambulatory Visit: Payer: Self-pay

## 2020-08-12 VITALS — BP 129/76 | HR 66 | Resp 12

## 2020-08-12 DIAGNOSIS — R030 Elevated blood-pressure reading, without diagnosis of hypertension: Secondary | ICD-10-CM

## 2020-12-15 ENCOUNTER — Ambulatory Visit (INDEPENDENT_AMBULATORY_CARE_PROVIDER_SITE_OTHER): Payer: Medicaid Other | Admitting: Internal Medicine

## 2020-12-15 ENCOUNTER — Other Ambulatory Visit: Payer: Self-pay | Admitting: Internal Medicine

## 2020-12-15 ENCOUNTER — Encounter: Payer: Self-pay | Admitting: Internal Medicine

## 2020-12-15 ENCOUNTER — Other Ambulatory Visit: Payer: Self-pay

## 2020-12-15 VITALS — BP 128/78 | HR 60 | Resp 10 | Ht 61.5 in | Wt 182.0 lb

## 2020-12-15 DIAGNOSIS — Z124 Encounter for screening for malignant neoplasm of cervix: Secondary | ICD-10-CM | POA: Diagnosis not present

## 2020-12-15 DIAGNOSIS — Z0000 Encounter for general adult medical examination without abnormal findings: Secondary | ICD-10-CM | POA: Diagnosis not present

## 2020-12-15 LAB — POCT WET PREP WITH KOH
Clue Cells Wet Prep HPF POC: NEGATIVE
KOH Prep POC: NEGATIVE
RBC Wet Prep HPF POC: NEGATIVE
Trichomonas, UA: NEGATIVE
Yeast Wet Prep HPF POC: NEGATIVE

## 2020-12-15 MED ORDER — LORATADINE 10 MG PO TABS: 10.0000 mg | ORAL_TABLET | Freq: Every day | ORAL | 11 refills | Status: DC

## 2020-12-15 MED ORDER — TRIAMCINOLONE ACETONIDE 0.1 % EX CREA: TOPICAL_CREAM | CUTANEOUS | 1 refills | Status: DC

## 2020-12-15 NOTE — Progress Notes (Signed)
Subjective:    Patient ID: Jody Bullock, female   DOB: 08/22/1980, 40 y.o.   MRN: [id number]   HPI  CPE with pap  1.  Pap:  Last pap in 2018 and normal.    2.  Mammogram:  Never.  Maternal grandmother had breast cancer in her 65s.  Survived, but died from other causes.  No other family members with cancer.    3.  Osteoprevention:  Not much intake of dairy.  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    4.  Guaiac Cards:  Never.  5.  Colonoscopy:  Never.  No family history of colon cancer.   6.  Immunizations:  Did get a Licensed conveyancer.  Immunization History  Administered Date(s) Administered   Hpv-Unspecified 10/29/2007, 12/23/2007, 04/29/2008   Influenza Inj Mdck Quad Pf 03/25/2019   Influenza,inj,Quad PF,6+ Mos 03/03/2013   PFIZER(Purple Top)SARS-COV-2 Vaccination 08/01/2019, 08/26/2019   Td 10/29/2007   Tdap 09/10/2015     7.  Glucose/Cholesterol:  No history of problems with glucose or cholesterol.    Current Meds  Medication Sig   fluticasone (FLONASE) 50 MCG/ACT nasal spray Place 2 sprays into both nostrils daily.   No Known Allergies  Past Medical History:  Diagnosis Date   Eczema    Environmental allergies    Hypertension    Pre eclampsia after birth of son in 2017   Past Surgical History:  Procedure Laterality Date   NO PAST SURGERIES     Family History  Problem Relation Age of Onset   Gallbladder disease Mother    Cancer Mother        Dysplastic endometrial cells--total hysterectomy.   Cancer Father        prostate    Anxiety disorder Father    Heart disease Father    Hyperlipidemia Father    Heart disease Brother        Died of AMI   Social History   Socioeconomic History   Marital status: Significant Other    Spouse name: Not on file   Number of children: Not on file   Years of education: Not on file   Highest education level: Not on file  Occupational History   Not on file  Tobacco Use   Smoking status: Never   Smokeless  tobacco: Never  Vaping Use   Vaping Use: Never used  Substance and Sexual Activity   Alcohol use: Not Currently    Comment: 3 shots of liquor on weekend   Drug use: Yes    Types: Marijuana    Comment: daily   Sexual activity: Yes    Partners: Male    Birth control/protection: Condom  Other Topics Concern   Not on file  Social History Narrative   Lives at home with 2 sons.   Father of sons involved--6 years younger and a good dad.     He is supportive.   Social Determinants of Health   Financial Resource Strain: Low Risk    Difficulty of Paying Living Expenses: Not hard at all  Food Insecurity: No Food Insecurity   Worried About Programme researcher, broadcasting/film/video in the Last Year: Never true   Ran Out of Food in the Last Year: Never true  Transportation Needs: No Transportation Needs   Lack of Transportation (Medical): No   Lack of Transportation (Non-Medical): No  Physical Activity: Not on file  Stress: Not on file  Social Connections: Not on file  Intimate Partner Violence:  Not At Risk   Fear of Current or Ex-Partner: No   Emotionally Abused: No   Physically Abused: No   Sexually Abused: No    Review of Systems  HENT:  Positive for postnasal drip (However, not using Fluticasone nasal spray more than twice weekly.  Outside all day long with [employer] lawn[employer].), rhinorrhea, sinus pressure and sneezing.   Skin:        Patches of eczema on ankles, elbows.  Ran out of triamcinolone cream.     Objective:   BP 128/78 (BP Location: Left Arm, Patient Position: Sitting, Cuff Size: Normal)   Pulse 60   Resp 10   Ht 5' 1.5" (1.562 m)   Wt 182 lb (82.6 kg)   BMI 33.83 kg/m   Physical Exam HENT:     Head: Normocephalic and atraumatic.     Right Ear: Tympanic membrane, ear canal and external ear normal.     Left Ear: Tympanic membrane, ear canal and external ear normal.     Nose: Rhinorrhea present. No congestion (Especially right nasal mucosa.).     Mouth/Throat:     Mouth:  Mucous membranes are moist.     Pharynx: Oropharynx is clear.  Eyes:     Extraocular Movements: Extraocular movements intact.     Conjunctiva/sclera: Conjunctivae normal.     Pupils: Pupils are equal, round, and reactive to light.     Comments: Discs sharp bilaterally.  Neck:     Thyroid: No thyroid mass or thyromegaly.  Cardiovascular:     Rate and Rhythm: Normal rate and regular rhythm.     Heart sounds: S1 normal and S2 normal. No murmur heard.   No friction rub. No S3 or S4 sounds.     Comments: Carotid, radial, femoral, DP and PT pulses normal and equal. Pulmonary:     Effort: Pulmonary effort is normal.     Breath sounds: Normal breath sounds.  Chest:  Breasts:    Right: No mass, nipple discharge, skin change or axillary adenopathy.     Left: No mass, nipple discharge, skin change or axillary adenopathy.  Abdominal:     General: Bowel sounds are normal.     Palpations: Abdomen is soft. There is no hepatomegaly, splenomegaly or mass.     Tenderness: There is no abdominal tenderness.     Hernia: No hernia is present.  Genitourinary:    Comments: Normal external female genitalia.   No vaginal discharge. Cervix retroverted, though uterus not particularly anteverted.  No uterine or adnexal mass or tenderness. Musculoskeletal:        General: Normal range of motion.     Cervical back: Normal range of motion and neck supple.  Lymphadenopathy:     Upper Body:     Right upper body: No axillary adenopathy.     Left upper body: No axillary adenopathy.  Skin:    General: Skin is warm.     Capillary Refill: Capillary refill takes less than 2 seconds.     Findings: No rash.  Neurological:     Mental Status: She is alert.     Cranial Nerves: Cranial nerves are intact.     Sensory: Sensation is intact.     Motor: Motor function is intact.     Coordination: Coordination is intact.     Gait: Gait is intact.     Deep Tendon Reflexes: Reflexes are normal and symmetric.   Psychiatric:        Attention and Perception: Attention  normal.        Mood and Affect: Mood and affect normal.        Speech: Speech normal.        Behavior: Behavior normal. Behavior is cooperative.     Assessment & Plan   CPE with pap Encouraged flu vaccine in fall--to call for clinic Follow up in 2 weeks for fasting labs and screen for STIs at patient request:  CBC, CMP, FLP  2.  Seasonal allergies:  to use Fluticasone nasal daily.  Loratadine only when needed.  Encouraged regular use of neti pot as well.  Call if no improvement if consistent with care.    3.  Encouraged healthier eating.  She is already very physically active.    4.  Eczema:  no findings today, but Triamcinolone refilled.

## 2020-12-17 LAB — CYTOLOGY - PAP

## 2020-12-28 ENCOUNTER — Other Ambulatory Visit: Payer: Self-pay

## 2020-12-28 ENCOUNTER — Other Ambulatory Visit (INDEPENDENT_AMBULATORY_CARE_PROVIDER_SITE_OTHER): Payer: Medicaid Other

## 2020-12-28 DIAGNOSIS — Z0000 Encounter for general adult medical examination without abnormal findings: Secondary | ICD-10-CM

## 2020-12-29 LAB — CBC WITH DIFFERENTIAL/PLATELET
Basophils Absolute: 0.1 10*3/uL (ref 0.0–0.2)
Basos: 1 %
EOS (ABSOLUTE): 0.1 10*3/uL (ref 0.0–0.4)
Eos: 3 %
Hematocrit: 34.2 % (ref 34.0–46.6)
Hemoglobin: 10.6 g/dL — ABNORMAL LOW (ref 11.1–15.9)
Immature Grans (Abs): 0 10*3/uL (ref 0.0–0.1)
Immature Granulocytes: 0 %
Lymphocytes Absolute: 1.5 10*3/uL (ref 0.7–3.1)
Lymphs: 33 %
MCH: 27.1 pg (ref 26.6–33.0)
MCHC: 31 g/dL — ABNORMAL LOW (ref 31.5–35.7)
MCV: 88 fL (ref 79–97)
Monocytes Absolute: 0.4 10*3/uL (ref 0.1–0.9)
Monocytes: 8 %
Neutrophils Absolute: 2.6 10*3/uL (ref 1.4–7.0)
Neutrophils: 55 %
Platelets: 178 10*3/uL (ref 150–450)
RBC: 3.91 x10E6/uL (ref 3.77–5.28)
RDW: 15.7 % — ABNORMAL HIGH (ref 11.7–15.4)
WBC: 4.7 10*3/uL (ref 3.4–10.8)

## 2020-12-29 LAB — COMPREHENSIVE METABOLIC PANEL
ALT: 9 IU/L (ref 0–32)
AST: 18 IU/L (ref 0–40)
Albumin/Globulin Ratio: 1.8 (ref 1.2–2.2)
Albumin: 4.2 g/dL (ref 3.8–4.8)
Alkaline Phosphatase: 55 IU/L (ref 44–121)
BUN/Creatinine Ratio: 10 (ref 9–23)
BUN: 8 mg/dL (ref 6–20)
Bilirubin Total: 0.2 mg/dL (ref 0.0–1.2)
CO2: 23 mmol/L (ref 20–29)
Calcium: 8.8 mg/dL (ref 8.7–10.2)
Chloride: 106 mmol/L (ref 96–106)
Creatinine, Ser: 0.77 mg/dL (ref 0.57–1.00)
Globulin, Total: 2.4 g/dL (ref 1.5–4.5)
Glucose: 82 mg/dL (ref 65–99)
Potassium: 4.1 mmol/L (ref 3.5–5.2)
Sodium: 139 mmol/L (ref 134–144)
Total Protein: 6.6 g/dL (ref 6.0–8.5)
eGFR: 101 mL/min/{1.73_m2} (ref >59)

## 2020-12-29 LAB — LIPID PANEL W/O CHOL/HDL RATIO
Cholesterol, Total: 162 mg/dL (ref 100–199)
HDL: 55 mg/dL (ref >39)
LDL Chol Calc (NIH): 94 mg/dL (ref 0–99)
Triglycerides: 68 mg/dL (ref 0–149)
VLDL Cholesterol Cal: 13 mg/dL (ref 5–40)

## 2021-05-03 ENCOUNTER — Other Ambulatory Visit: Payer: Medicaid Other

## 2021-07-03 NOTE — Progress Notes (Signed)
BP fine today.  No medication.

## 2021-07-20 ENCOUNTER — Telehealth: Payer: Self-pay | Admitting: Internal Medicine

## 2021-07-20 NOTE — Telephone Encounter (Signed)
Pt. Called stating she has been having neck pain since she woke up on Sunday.She states she has not taken any pain relief medication but did take a muscle relaxer that didn't really help with pain, it only made her sleepy. ? ?Discussed with Dr. Delrae Alfred. ?Pt. To take Ibuprofen 400 to 600 mg every 6 hours with food. Pt. Added to wait list for an acute appointment to evaluate. ? ?Pt. Informed- verbalized understanding  ?

## 2021-08-09 ENCOUNTER — Encounter: Payer: Self-pay | Admitting: Internal Medicine

## 2021-08-09 ENCOUNTER — Other Ambulatory Visit: Payer: Self-pay

## 2021-08-09 ENCOUNTER — Ambulatory Visit (INDEPENDENT_AMBULATORY_CARE_PROVIDER_SITE_OTHER): Payer: Medicaid Other | Admitting: Internal Medicine

## 2021-08-09 VITALS — BP 118/80 | HR 68 | Resp 12 | Ht 61.5 in | Wt 191.0 lb

## 2021-08-09 DIAGNOSIS — J3089 Other allergic rhinitis: Secondary | ICD-10-CM

## 2021-08-09 DIAGNOSIS — Z6834 Body mass index (BMI) 34.0-34.9, adult: Secondary | ICD-10-CM | POA: Diagnosis not present

## 2021-08-09 DIAGNOSIS — E6609 Other obesity due to excess calories: Secondary | ICD-10-CM | POA: Diagnosis not present

## 2021-08-09 MED ORDER — FLUTICASONE PROPIONATE 50 MCG/ACT NA SUSP: 2.0000 | Freq: Every day | NASAL | 11 refills | Status: DC

## 2021-08-09 MED ORDER — LEVOCETIRIZINE DIHYDROCHLORIDE 5 MG PO TABS: 5.0000 mg | ORAL_TABLET | Freq: Every evening | ORAL | 11 refills | Status: DC

## 2021-08-09 NOTE — Progress Notes (Signed)
    Subjective:    Patient ID: Jody Bullock, female   DOB: 09/10/1980, 41 y.o.   MRN: [id number]   HPI  Had neck pain, but went away with ibuprofen  Developed cough and congestion about 3 weeks ago.  Checked for COVID and negative, but cough and congestion continued.  Has been taking Robitussin, which has helped break up the congestion.  Phlegm is clear.  Lot of posterior pharyngeal drainage.  No itchy, watery eyes, nose or throat.  No fever.  No definite dyspnea.  Taking Flonase and Loratadine, perhaps 4-5 times weekly  Current Meds  Medication Sig   fluticasone (FLONASE) 50 MCG/ACT nasal spray Place 2 sprays into both nostrils daily. (Patient taking differently: Place 2 sprays into both nostrils daily. occasional)   loratadine (CLARITIN) 10 MG tablet Take 1 tablet (10 mg total) by mouth daily.   triamcinolone cream (KENALOG) 0.1 % Apply to affected areas of skin twice daily as needed   No Known Allergies   Review of Systems    Objective:   BP 118/80 (BP Location: Left Arm, Patient Position: Sitting, Cuff Size: Normal)   Pulse 68   Resp 12   Ht 5' 1.5" (1.562 m)   Wt 191 lb (86.6 kg)   LMP 08/05/2021 (Exact Date)   BMI 35.50 kg/m   Physical Exam NAD HEENT:  PERRL, EOMI, conjunctivae without injection.  TMs pearly gray.  Mild cobbling of posterior pharynx.  Nasal mucosa swollen/boggy with clear discharge. Neck:  Supple, No adenopathy Chest:  CTA CV:  RRR without murmur or rub.  Radial pulses normal and equa. Abd:  S, NT, No HSM or mass, + BS   Assessment & Plan    Environmental and seasonal allergies.  Changes to Xyzal 5 mg daily and use Flonase daily.  Consider neti pot daily before use of nasal corticosteroid.  2.  Obesity:  discussion regarding lifestyle goals for weight loss.

## 2021-08-09 NOTE — Patient Instructions (Signed)
Make goals:   ?Calendar to write goals is first ?Weekly goal for changing your eating and physical activity--start slow and gradually increase on weekly basis. ?

## 2021-12-15 ENCOUNTER — Encounter: Payer: Medicaid Other | Admitting: Internal Medicine

## 2022-02-15 ENCOUNTER — Ambulatory Visit (INDEPENDENT_AMBULATORY_CARE_PROVIDER_SITE_OTHER): Payer: Medicaid Other | Admitting: Internal Medicine

## 2022-02-15 ENCOUNTER — Encounter: Payer: Self-pay | Admitting: Internal Medicine

## 2022-02-15 VITALS — BP 130/76 | HR 64 | Resp 16 | Ht 61.5 in | Wt 192.0 lb

## 2022-02-15 DIAGNOSIS — Z0000 Encounter for general adult medical examination without abnormal findings: Secondary | ICD-10-CM | POA: Diagnosis not present

## 2022-02-15 DIAGNOSIS — Z23 Encounter for immunization: Secondary | ICD-10-CM

## 2022-02-15 DIAGNOSIS — D649 Anemia, unspecified: Secondary | ICD-10-CM

## 2022-02-15 DIAGNOSIS — Z1231 Encounter for screening mammogram for malignant neoplasm of breast: Secondary | ICD-10-CM

## 2022-02-15 DIAGNOSIS — Z113 Encounter for screening for infections with a predominantly sexual mode of transmission: Secondary | ICD-10-CM

## 2022-02-15 DIAGNOSIS — T1490XA Injury, unspecified, initial encounter: Secondary | ICD-10-CM

## 2022-02-15 NOTE — Progress Notes (Unsigned)
Subjective:    Patient ID: Jody Bullock, female   DOB: 06-23-1980, 41 y.o.   MRN: [id number]   HPI  CPE without pap  1.  Pap:  Last pap was 12/2020 and normal.    2.  Mammogram:  Never.  Maternal grandmother with breast cancer in her 7s.  Not cause of death.  3.  Osteoprevention:  1 cup of milk daily.  Outside a lot.  She is very physically active.    4.  Guaiac Cards/FIT: Never  5.  Colonoscopy:  Never.  No family history of colon cancer.   6.  Immunizations:  Needs influenza. Immunization History  Administered Date(s) Administered   Hpv-Unspecified 10/29/2007, 12/23/2007, 04/29/2008   Influenza Inj Mdck Quad Pf 03/25/2019   Influenza,inj,Quad PF,6+ Mos 03/03/2013   PFIZER(Purple Top)SARS-COV-2 Vaccination 08/01/2019, 08/26/2019   Td 10/29/2007   Tdap 09/10/2015     7.  Glucose/Cholesterol:  Blood glucose and cholesterol fine in past. Lipid Panel     Component Value Date/Time   CHOL 162 12/28/2020 0934   TRIG 68 12/28/2020 0934   HDL 55 12/28/2020 0934   CHOLHDL 2 11/19/2012 0946   VLDL 10.6 11/19/2012 0946   LDLCALC 94 12/28/2020 0934   LABVLDL 13 12/28/2020 0934     Current Meds  Medication Sig   Ferrous Sulfate (IRON PO) Take by mouth.   fluticasone (FLONASE) 50 MCG/ACT nasal spray Place 2 sprays into both nostrils daily.   levocetirizine (XYZAL) 5 MG tablet Take 1 tablet (5 mg total) by mouth every evening.   triamcinolone cream (KENALOG) 0.1 % Apply to affected areas of skin twice daily as needed   No Known Allergies  Past Medical History:  Diagnosis Date   Eczema    Environmental allergies    Hypertension    Pre eclampsia after birth of son in 2017   Past Surgical History:  Procedure Laterality Date   NO PAST SURGERIES     Family History  Problem Relation Age of Onset   Gallbladder disease Mother    Cancer Mother        Dysplastic endometrial cells--total hysterectomy.   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  New partner in past year.  Other Topics Concern   Not on file  Social History Narrative   Lives at home with 2 sons.   Father of sons involved--6 years younger and a good dad.     He  is supportive.   Jody Bullock is her aunt   Social Determinants of Radio broadcast assistant Strain: Low Risk  (02/15/2022)   Overall Financial Resource Strain (CARDIA)    Difficulty of Paying Living Expenses: Not hard at all  Food Insecurity: No Food Insecurity (02/15/2022)   Hunger Vital Sign    Worried About Running Out of Food in the Last Year: Never true    Ran Out of Food in the Last Year: Never true  Transportation Needs: No Transportation Needs (02/15/2022)   PRAPARE - Hydrologist (Medical): No    Lack of Transportation (Non-Medical): No  Physical Activity: Unknown (02/05/2019)   Exercise Vital Sign    Days of Exercise per Week: 7 days    Minutes of Exercise per Session: Not on file  Stress: Not on file  Social Connections: Unknown (02/05/2019)   Social Connection and Isolation Panel [NHANES]    Frequency of Communication with Friends and Family: Not on file    Frequency of Social Gatherings with Friends  and Family: Not on file    Attends Religious Services: Not on file    Active Member of Clubs or Organizations: Not on file    Attends Archivist Meetings: Not on file    Marital Status: Never married  Intimate Partner Violence: Not At Risk (02/15/2022)   Humiliation, Afraid, Rape, and Kick questionnaire    Fear of Current or Ex-Partner: No    Emotionally Abused: No    Physically Abused: No    Sexually Abused: No      Review of Systems    Objective:   BP 130/76 (BP Location: Left Arm, Patient Position: Sitting, Cuff Size: Normal)   Pulse 64   Resp 16   Ht 5' 1.5" (1.562 m)   Wt 192 lb (87.1 kg)   LMP 01/22/2022 (Within Days)   BMI 35.69 kg/m   Physical Exam HENT:     Head: Normocephalic and atraumatic.     Right Ear: Tympanic membrane, ear canal and external ear normal.     Left Ear: Tympanic membrane, ear canal and external ear normal.     Nose: Nose normal.     Mouth/Throat:     Mouth: Mucous membranes are moist.     Pharynx: Oropharynx is clear.     Comments: Some dental decay. Eyes:     Extraocular Movements: Extraocular movements intact.     Conjunctiva/sclera: Conjunctivae normal.     Pupils: Pupils are equal, round, and reactive to light.     Comments: Discs sharp.  Neck:     Thyroid: No thyroid mass or thyromegaly.  Cardiovascular:     Rate and Rhythm: Normal rate and regular rhythm.     Heart sounds: S1 normal and S2 normal. No murmur heard.    No friction rub. No S3 or S4 sounds.     Comments: No carotid bruits.  Carotid, radial, femoral, DP and PT pulses normal and equal.    Pulmonary:     Effort: Pulmonary effort is normal.     Breath sounds: Normal breath sounds and air entry.  Chest:  Breasts:    Right: No inverted nipple, mass, nipple discharge or skin change.     Left: No inverted nipple, mass, nipple discharge or skin change.  Abdominal:     General: Bowel sounds are normal.     Palpations: Abdomen is soft. There is no  hepatomegaly, splenomegaly or mass.  Tenderness: There is no abdominal tenderness.     Hernia: No hernia is present.  Genitourinary:    Comments: Normal external female genitalia. No vaginal discharge  No uterine or adnexal mass or tenderness. Musculoskeletal:        General: Normal range of motion.     Cervical back: Normal range of motion and neck supple.     Right lower leg: No edema.     Left lower leg: No edema.  Lymphadenopathy:     Head:     Right side of head: No submental or submandibular adenopathy.     Left side of head: No submental or submandibular adenopathy.     Cervical: No cervical adenopathy.     Upper Body:     Right upper body: No supraclavicular or axillary adenopathy.     Left upper body: No supraclavicular or axillary adenopathy.     Lower Body: No right inguinal adenopathy. No left inguinal adenopathy.  Skin:    General: Skin is warm.     Capillary Refill: Capillary refill takes less than 2 seconds.     Findings: No rash.  Neurological:     General: No focal deficit present.     Mental Status: She is alert and oriented to person, place, and time.     Cranial Nerves: Cranial nerves 2-12 are intact.     Sensory: Sensation is intact.     Motor: Motor function is intact.     Coordination: Coordination is intact.     Gait: Gait is intact.     Deep Tendon Reflexes: Reflexes are normal and symmetric.  Psychiatric:        Attention and Perception: Attention normal.        Mood and Affect: Affect is tearful (Only at end of exam when mentions past trauma in life and agrees to counseling.  States her partner has been encouraging her to obtain.).        Behavior: Behavior normal. Behavior is cooperative.     Exam normal Assessment & Plan    CPE without pap Influenza vaccination given Mammogram after 41th birthday FIT to return after birthday.  2.  Trauma in past:  interested in counseling with Gala Romney.    3.  Relatively new partner:  STI  evaluation.  HIV, RPR, GC/chlamydia.  No symptoms.  4.  History of anemia, mild:  Taking iron.  CBC

## 2022-02-15 NOTE — Patient Instructions (Signed)
Please go to your favorite pharmacy or PHD for covid vaccination.

## 2022-02-16 ENCOUNTER — Other Ambulatory Visit: Payer: Self-pay | Admitting: Internal Medicine

## 2022-02-16 DIAGNOSIS — D649 Anemia, unspecified: Secondary | ICD-10-CM | POA: Insufficient documentation

## 2022-02-16 DIAGNOSIS — T1490XA Injury, unspecified, initial encounter: Secondary | ICD-10-CM | POA: Insufficient documentation

## 2022-02-16 LAB — RPR QUALITATIVE: RPR Ser Ql: NONREACTIVE

## 2022-02-16 LAB — CBC WITH DIFFERENTIAL/PLATELET
Basophils Absolute: 0.1 10*3/uL (ref 0.0–0.2)
Basos: 1 %
EOS (ABSOLUTE): 0.1 10*3/uL (ref 0.0–0.4)
Eos: 3 %
Hematocrit: 37.2 % (ref 34.0–46.6)
Hemoglobin: 12.1 g/dL (ref 11.1–15.9)
Immature Grans (Abs): 0 10*3/uL (ref 0.0–0.1)
Immature Granulocytes: 0 %
Lymphocytes Absolute: 1.7 10*3/uL (ref 0.7–3.1)
Lymphs: 33 %
MCH: 28.8 pg (ref 26.6–33.0)
MCHC: 32.5 g/dL (ref 31.5–35.7)
MCV: 89 fL (ref 79–97)
Monocytes Absolute: 0.4 10*3/uL (ref 0.1–0.9)
Monocytes: 8 %
Neutrophils Absolute: 2.7 10*3/uL (ref 1.4–7.0)
Neutrophils: 55 %
Platelets: 212 10*3/uL (ref 150–450)
RBC: 4.2 x10E6/uL (ref 3.77–5.28)
RDW: 13.8 % (ref 11.7–15.4)
WBC: 5 10*3/uL (ref 3.4–10.8)

## 2022-02-16 LAB — HIV ANTIBODY (ROUTINE TESTING W REFLEX): HIV Screen 4th Generation wRfx: NONREACTIVE

## 2022-02-17 LAB — GC/CHLAMYDIA PROBE AMP
Chlamydia trachomatis, NAA: NEGATIVE
Neisseria Gonorrhoeae by PCR: NEGATIVE

## 2022-03-16 ENCOUNTER — Ambulatory Visit
Admission: RE | Admit: 2022-03-16 | Discharge: 2022-03-16 | Disposition: A | Discharge status: Home / Self Care | Payer: Medicaid Other | Source: Ambulatory Visit | Attending: Internal Medicine | Admitting: Internal Medicine

## 2022-03-16 DIAGNOSIS — Z1231 Encounter for screening mammogram for malignant neoplasm of breast: Secondary | ICD-10-CM

## 2022-04-28 ENCOUNTER — Ambulatory Visit: Payer: Medicaid Other | Admitting: Internal Medicine

## 2022-04-28 ENCOUNTER — Encounter: Payer: Self-pay | Admitting: Internal Medicine

## 2022-04-28 VITALS — BP 136/88 | HR 64 | Resp 12 | Ht 61.5 in | Wt 194.0 lb

## 2022-04-28 DIAGNOSIS — R059 Cough, unspecified: Secondary | ICD-10-CM

## 2022-04-28 DIAGNOSIS — J069 Acute upper respiratory infection, unspecified: Secondary | ICD-10-CM

## 2022-04-28 LAB — POCT INFLUENZA A/B
Influenza A, POC: NEGATIVE
Influenza B, POC: NEGATIVE

## 2022-04-28 LAB — POC COVID19 BINAXNOW: SARS Coronavirus 2 Ag: NEGATIVE

## 2022-04-28 NOTE — Patient Instructions (Signed)
Push fluids Cool mist humidifier Ibuprofen 600-800 mg every 6 hours with food as needed for throat pain.

## 2022-04-28 NOTE — Progress Notes (Unsigned)
    Subjective:    Patient ID: Jody Bullock, female   DOB: 12-Apr-1981, 41 y.o.   MRN: [id number]   HPI  For about 1 week, congestion and then into chest.  Started Mucinex and now chest congestion better as mucous breaking up, but just still having throat pain--like poking discomfort.  + posterior pharyngeal drainage  No sinus pressure or pain.  No fever, but sweating at night.   Feels she is improving:  her energy is improved.   Son with similar symptoms before she started with hers.   Only taking Mucinex.  Delsym.   Mucous is thick and milky.    Current Meds  Medication Sig   Acetaminophen-guaiFENesin (MUCINEX COLD & FLU PO) Take by mouth.   Ferrous Sulfate (IRON PO) Take by mouth.   fluticasone (FLONASE) 50 MCG/ACT nasal spray Place 2 sprays into both nostrils daily.   levocetirizine (XYZAL) 5 MG tablet Take 1 tablet (5 mg total) by mouth every evening.   triamcinolone cream (KENALOG) 0.1 % APPLY TOPICALLY TO THE AFFECTED AREA TWICE DAILY AS NEEDED   No Known Allergies   Review of Systems    Objective:   BP 136/88 (BP Location: Right Arm, Patient Position: Sitting, Cuff Size: Normal)   Pulse 64   Resp 12   Ht 5' 1.5" (1.562 m)   Wt 194 lb (88 kg)   BMI 36.06 kg/m   Physical Exam   Assessment & Plan

## 2022-12-07 ENCOUNTER — Inpatient Hospital Stay (HOSPITAL_COMMUNITY): Payer: Medicaid Other

## 2022-12-07 ENCOUNTER — Inpatient Hospital Stay (HOSPITAL_COMMUNITY)
Admission: AD | Admit: 2022-12-07 | Discharge: 2022-12-07 | Disposition: A | Discharge status: Home / Self Care | Payer: Medicaid Other | Attending: Obstetrics and Gynecology | Admitting: Obstetrics and Gynecology

## 2022-12-07 ENCOUNTER — Encounter (HOSPITAL_COMMUNITY): Payer: Self-pay | Admitting: *Deleted

## 2022-12-07 ENCOUNTER — Other Ambulatory Visit: Payer: Self-pay | Admitting: Certified Nurse Midwife

## 2022-12-07 DIAGNOSIS — N939 Abnormal uterine and vaginal bleeding, unspecified: Secondary | ICD-10-CM

## 2022-12-07 DIAGNOSIS — O3680X0 Pregnancy with inconclusive fetal viability, not applicable or unspecified: Secondary | ICD-10-CM | POA: Diagnosis not present

## 2022-12-07 DIAGNOSIS — R7989 Other specified abnormal findings of blood chemistry: Secondary | ICD-10-CM | POA: Diagnosis not present

## 2022-12-07 DIAGNOSIS — O209 Hemorrhage in early pregnancy, unspecified: Secondary | ICD-10-CM | POA: Diagnosis present

## 2022-12-07 DIAGNOSIS — Z3A01 Less than 8 weeks gestation of pregnancy: Secondary | ICD-10-CM | POA: Diagnosis not present

## 2022-12-07 LAB — CBC
HCT: 34.6 % — ABNORMAL LOW (ref 36.0–46.0)
Hemoglobin: 11.1 g/dL — ABNORMAL LOW (ref 12.0–15.0)
MCH: 27.1 pg (ref 26.0–34.0)
MCHC: 32.1 g/dL (ref 30.0–36.0)
MCV: 84.4 fL (ref 80.0–100.0)
Platelets: 218 10*3/uL (ref 150–400)
RBC: 4.1 MIL/uL (ref 3.87–5.11)
RDW: 17.3 % — ABNORMAL HIGH (ref 11.5–15.5)
WBC: 5.1 10*3/uL (ref 4.0–10.5)
nRBC: 0 % (ref 0.0–0.2)

## 2022-12-07 LAB — HCG, QUANTITATIVE, PREGNANCY: hCG, Beta Chain, Quant, S: 16027 m[IU]/mL — ABNORMAL HIGH (ref <5)

## 2022-12-07 LAB — POCT PREGNANCY, URINE: Preg Test, Ur: POSITIVE — AB

## 2022-12-07 NOTE — MAU Note (Signed)
Jody Bullock is a 41 y.o. at Unknown here in MAU reporting: +HPT a couple of wks ago.  Went to clinic, they couldn't see anything on [place], was to go back tomorrow for f/u.  Started spotting yesterday,  started heavier today, but still scant, no clots, having some cramping. LMP: 6/6 Onset of complaint: yesterday Pain score: mild Vitals:   12/07/22 1248  BP: (!) 146/83  Pulse: 73  Resp: 17  Temp: 98.5 F (36.9 C)  SpO2: 100%      Lab orders placed from triage:  upt is + here "CUB" per CNM.... Opos.  Pt aware

## 2022-12-07 NOTE — MAU Provider Note (Signed)
History     CSN: [id number]  Arrival date and time: 12/07/22 1149   Event Date/Time   First Provider Initiated Contact with Patient 12/07/22 1638      Chief Complaint  Patient presents with   Vaginal Bleeding   Abdominal Pain   Possible Pregnancy   Jody Bullock , a  42 y.o. [id number] at [redacted]w[redacted]d presents to MAU with complaints of spotting and vaginal bleeding that started yesterday. Unplanned and Undesired pregnancy. She states that she went to the clinic and was told "they didn't see anything on [place]." And in the presence of a positive UPT was recommended to report to MAU. She states that she was just spotting yesterday, with out pain and today she reports bright red vaginal bleeding, denies saturating a pad and denies passing clots but noted that "is way more than yesterday." She also reports some intermittent "low belly pain", but states that that its like a 1 or 2/10. She denies attempting to relieve symptoms.          OB History     Gravida  4   Para  2   Term  1   Preterm  1   AB  1   Living  2      SAB  1   IAB  0   Ectopic  0   Multiple  0   Live Births  2           Past Medical History:  Diagnosis Date   Eczema    Environmental allergies    Hypertension    Pre eclampsia after birth of son in 2017    Past Surgical History:  Procedure Laterality Date   NO PAST SURGERIES      Family History  Problem Relation Age of Onset   Gallbladder disease Mother    Cancer Mother        Dysplastic endometrial cells--total hysterectomy.   Cancer Father        prostate    Anxiety disorder Father    Heart disease Father    Hyperlipidemia Father    Breast cancer Maternal Grandmother 49 - 12   Heart disease Brother        Died of AMI    Social History   Tobacco Use   Smoking status: Never    Passive exposure: Never   Smokeless tobacco: Never  Vaping Use   Vaping status: Never Used  Substance Use Topics   Alcohol use: Not Currently     Comment: 3 shots of liquor on weekend   Drug use: Yes    Types: Marijuana    Comment: daily    Allergies: No Known Allergies  Medications Prior to Admission  Medication Sig Dispense Refill Last Dose   Acetaminophen-guaiFENesin (MUCINEX COLD & FLU PO) Take by mouth.      Ferrous Sulfate (IRON PO) Take by mouth.      fluticasone (FLONASE) 50 MCG/ACT nasal spray Place 2 sprays into both nostrils daily. 16 g 11    levocetirizine (XYZAL) 5 MG tablet Take 1 tablet (5 mg total) by mouth every evening. 30 tablet 11    triamcinolone cream (KENALOG) 0.1 % APPLY TOPICALLY TO THE AFFECTED AREA TWICE DAILY AS NEEDED 80 g 1     Review of Systems  Constitutional:  Negative for chills, fatigue and fever.  Eyes:  Negative for pain and visual disturbance.  Respiratory:  Negative for apnea, shortness of breath and wheezing.   Cardiovascular:  Negative for chest pain and palpitations.  Gastrointestinal:  Positive for abdominal pain. Negative for constipation, diarrhea, nausea and vomiting.  Genitourinary:  Positive for vaginal bleeding and vaginal discharge. Negative for difficulty urinating, dysuria, pelvic pain and vaginal pain.  Musculoskeletal:  Negative for back pain.  Neurological:  Negative for seizures, weakness and headaches.  Psychiatric/Behavioral:  Negative for suicidal ideas.    Physical Exam   Blood pressure 126/80, pulse 71, temperature 98.5 F (36.9 C), temperature source Oral, resp. rate 17, weight 90.1 kg, last menstrual period 10/19/2022, SpO2 100%.  Physical Exam Vitals and nursing note reviewed.  Constitutional:      General: She is not in acute distress.    Appearance: Normal appearance.  HENT:     Head: Normocephalic.  Pulmonary:     Effort: Pulmonary effort is normal.  Musculoskeletal:     Cervical back: Normal range of motion.  Skin:    General: Skin is warm and dry.  Neurological:     Mental Status: She is alert and oriented to person, place, and time.   Psychiatric:        Mood and Affect: Mood normal.     MAU Course  Procedures Orders Placed This Encounter  Procedures   US OB LESS THAN 14 WEEKS WITH OB TRANSVAGINAL   CBC   hCG, quantitative, pregnancy   Pregnancy, urine POC   Discharge patient   Results for orders placed or performed during the hospital encounter of 12/07/22 (from the past 24 hour(s))  Pregnancy, urine POC     Status: Abnormal   Collection Time: 12/07/22 12:33 PM  Result Value Ref Range   Preg Test, Ur POSITIVE (A) NEGATIVE  CBC     Status: Abnormal   Collection Time: 12/07/22  1:16 PM  Result Value Ref Range   WBC 5.1 4.0 - 10.5 K/uL   RBC 4.10 3.87 - 5.11 MIL/uL   Hemoglobin 11.1 (L) 12.0 - 15.0 g/dL   HCT 47.8 (L) 29.5 - 62.1 %   MCV 84.4 80.0 - 100.0 fL   MCH 27.1 26.0 - 34.0 pg   MCHC 32.1 30.0 - 36.0 g/dL   RDW 30.8 (H) 65.7 - 84.6 %   Platelets 218 150 - 400 K/uL   nRBC 0.0 0.0 - 0.2 %  hCG, quantitative, pregnancy     Status: Abnormal   Collection Time: 12/07/22  1:16 PM  Result Value Ref Range   hCG, Beta Chain, Quant, S 16,027 (H) <5 mIU/mL   US OB LESS THAN 14 WEEKS WITH OB TRANSVAGINAL  Result Date: 12/07/2022 CLINICAL DATA:  Vaginal bleeding EXAM: OBSTETRIC <14 WK [place] AND TRANSVAGINAL OB US TECHNIQUE: Both transabdominal and transvaginal ultrasound examinations were performed for complete evaluation of the gestation as well as the maternal uterus, adnexal regions, and pelvic cul-de-sac. Transvaginal technique was performed to assess early pregnancy. COMPARISON:  None Available. FINDINGS: Intrauterine gestational sac: None Yolk sac:  Not Visualized. Embryo:  Not Visualized. Cardiac Activity: Not Visualized. Subchorionic hemorrhage:  None visualized. Maternal uterus/adnexae: Heterogeneous and thickened endometrium, measuring up to 4.6 cm. Normal appearance of the bilateral ovaries. Right-sided fibroid measuring up to 4.8 cm. IMPRESSION: 1. No intrauterine gestational sac, yolk sac, or fetal pole  identified. In the setting of positive pregnancy test and no definite intrauterine pregnancy, this reflects a pregnancy of unknown location. Differential considerations include early normal IUP, abnormal IUP, or nonvisualized ectopic pregnancy. Differentiation is achieved with serial beta HCG supplemented by repeat sonography as clinically warranted. 2.  Heterogeneous and thickened endometrium. Electronically Signed   By: Allegra Lai M.D.   On: 12/07/2022 16:23    MDM - Hgb 11.1 and platelet count 218- patient hemodynamically stable.  - US showed normal Ovaries and tubes with no IUP.  - Quant >16,000.  - Consulted Dr. Alysia Penna on such high quant with a pregnancy of unknown location. Reviewed patient presentation and current clinical picture.  - Per MD have patient return in 48 hours for quant follow up and discharge home with ectopic precautions. - Plan to discharge home   Assessment and Plan  -  1. Pregnancy of unknown anatomic location   2. [redacted] weeks gestation of pregnancy   3. Elevated serum hCG   4. Vaginal spotting    - Strict ectopic precautions reviewed with patient.  - Reviewed results of pregnancy of unknown location and  Recommendation to return to MAU on 7/27 for repeat quant.  - Discussed that with bleeding occurring, may be a miscarriage or if quant continues to rise may need intervention. Patient verbalized understanding.  - Worsening signs and return precautions reviewed.  - Patient discharged home in stable condition and may return to MAU as needed.   Claudette Head, MSN CNM  12/07/2022, 4:52 PM

## 2022-12-09 ENCOUNTER — Inpatient Hospital Stay (HOSPITAL_COMMUNITY)
Admission: AD | Admit: 2022-12-09 | Discharge: 2022-12-09 | Disposition: A | Discharge status: Home / Self Care | Payer: Medicaid Other | Attending: Obstetrics & Gynecology | Admitting: Obstetrics & Gynecology

## 2022-12-09 ENCOUNTER — Inpatient Hospital Stay (HOSPITAL_COMMUNITY): Payer: Medicaid Other

## 2022-12-09 ENCOUNTER — Inpatient Hospital Stay (EMERGENCY_DEPARTMENT_HOSPITAL): Payer: Medicaid Other | Admitting: Anesthesiology

## 2022-12-09 ENCOUNTER — Inpatient Hospital Stay (HOSPITAL_COMMUNITY): Payer: Medicaid Other | Admitting: Anesthesiology

## 2022-12-09 ENCOUNTER — Inpatient Hospital Stay (HOSPITAL_COMMUNITY)
Admit: 2022-12-09 | Discharge: 2022-12-09 | Disposition: A | Discharge status: Home / Self Care | Payer: Medicaid Other | Attending: Family Medicine | Admitting: Family Medicine

## 2022-12-09 ENCOUNTER — Other Ambulatory Visit: Payer: Self-pay

## 2022-12-09 ENCOUNTER — Encounter (HOSPITAL_COMMUNITY): Payer: Self-pay | Admitting: Family Medicine

## 2022-12-09 ENCOUNTER — Encounter (HOSPITAL_COMMUNITY)
Admission: AD | Disposition: A | Discharge status: Home / Self Care | Payer: Self-pay | Source: Home / Self Care | Attending: Obstetrics & Gynecology

## 2022-12-09 DIAGNOSIS — O00101 Right tubal pregnancy without intrauterine pregnancy: Secondary | ICD-10-CM | POA: Diagnosis not present

## 2022-12-09 DIAGNOSIS — D259 Leiomyoma of uterus, unspecified: Secondary | ICD-10-CM | POA: Insufficient documentation

## 2022-12-09 DIAGNOSIS — O009 Unspecified ectopic pregnancy without intrauterine pregnancy: Secondary | ICD-10-CM

## 2022-12-09 DIAGNOSIS — Z3A01 Less than 8 weeks gestation of pregnancy: Secondary | ICD-10-CM

## 2022-12-09 DIAGNOSIS — F1290 Cannabis use, unspecified, uncomplicated: Secondary | ICD-10-CM | POA: Diagnosis not present

## 2022-12-09 DIAGNOSIS — I10 Essential (primary) hypertension: Secondary | ICD-10-CM | POA: Diagnosis not present

## 2022-12-09 DIAGNOSIS — Z6836 Body mass index (BMI) 36.0-36.9, adult: Secondary | ICD-10-CM | POA: Diagnosis not present

## 2022-12-09 DIAGNOSIS — E669 Obesity, unspecified: Secondary | ICD-10-CM | POA: Diagnosis not present

## 2022-12-09 DIAGNOSIS — K661 Hemoperitoneum: Secondary | ICD-10-CM

## 2022-12-09 HISTORY — DX: Hemoperitoneum: K66.1

## 2022-12-09 HISTORY — DX: Right tubal pregnancy without intrauterine pregnancy: O00.101

## 2022-12-09 HISTORY — PX: LAPAROSCOPIC UNILATERAL SALPINGECTOMY: SHX5934

## 2022-12-09 LAB — CBC
HCT: 29.9 % — ABNORMAL LOW (ref 36.0–46.0)
Hemoglobin: 9.8 g/dL — ABNORMAL LOW (ref 12.0–15.0)
MCH: 28.2 pg (ref 26.0–34.0)
MCHC: 32.8 g/dL (ref 30.0–36.0)
MCV: 85.9 fL (ref 80.0–100.0)
Platelets: 212 10*3/uL (ref 150–400)
RBC: 3.48 MIL/uL — ABNORMAL LOW (ref 3.87–5.11)
RDW: 17.6 % — ABNORMAL HIGH (ref 11.5–15.5)
WBC: 10.1 10*3/uL (ref 4.0–10.5)
nRBC: 0 % (ref 0.0–0.2)

## 2022-12-09 LAB — TYPE AND SCREEN
ABO/RH(D): O POS
Antibody Screen: NEGATIVE

## 2022-12-09 LAB — HCG, QUANTITATIVE, PREGNANCY: hCG, Beta Chain, Quant, S: 17339 m[IU]/mL — ABNORMAL HIGH (ref <5)

## 2022-12-09 SURGERY — SALPINGECTOMY, UNILATERAL, LAPAROSCOPIC
Anesthesia: General | Laterality: Right

## 2022-12-09 MED ORDER — SCOPOLAMINE 1 MG/3DAYS TD PT72
MEDICATED_PATCH | TRANSDERMAL | Status: AC
  Administered 2022-12-09: 1.5 mg via TRANSDERMAL
  Filled 2022-12-09: qty 1

## 2022-12-09 MED ORDER — ACETAMINOPHEN 500 MG PO TABS
ORAL_TABLET | ORAL | Status: AC
  Administered 2022-12-09: 1000 mg via ORAL
  Filled 2022-12-09: qty 2

## 2022-12-09 MED ORDER — CHLORHEXIDINE GLUCONATE 0.12 % MT SOLN: 15.0000 mL | Freq: Once | OROMUCOSAL | Status: AC

## 2022-12-09 MED ORDER — ROCURONIUM BROMIDE 10 MG/ML (PF) SYRINGE
PREFILLED_SYRINGE | INTRAVENOUS | Status: DC | PRN
  Administered 2022-12-09: 40 mg via INTRAVENOUS

## 2022-12-09 MED ORDER — ONDANSETRON HCL 4 MG/2ML IJ SOLN
INTRAMUSCULAR | Status: DC | PRN
  Administered 2022-12-09: 4 mg via INTRAVENOUS

## 2022-12-09 MED ORDER — KETOROLAC TROMETHAMINE 30 MG/ML IJ SOLN
INTRAMUSCULAR | Status: AC
  Filled 2022-12-09: qty 1

## 2022-12-09 MED ORDER — ONDANSETRON 4 MG PO TBDP: 4.0000 mg | ORAL_TABLET | Freq: Three times a day (TID) | ORAL | 0 refills | Status: DC | PRN

## 2022-12-09 MED ORDER — FENTANYL CITRATE (PF) 250 MCG/5ML IJ SOLN
INTRAMUSCULAR | Status: AC
  Filled 2022-12-09: qty 5

## 2022-12-09 MED ORDER — PHENYLEPHRINE 80 MCG/ML (10ML) SYRINGE FOR IV PUSH (FOR BLOOD PRESSURE SUPPORT)
PREFILLED_SYRINGE | INTRAVENOUS | Status: AC
  Filled 2022-12-09: qty 10

## 2022-12-09 MED ORDER — FENTANYL CITRATE (PF) 250 MCG/5ML IJ SOLN
INTRAMUSCULAR | Status: DC | PRN
  Administered 2022-12-09: 50 ug via INTRAVENOUS
  Administered 2022-12-09: 100 ug via INTRAVENOUS

## 2022-12-09 MED ORDER — DOCUSATE SODIUM 100 MG PO CAPS: 100.0000 mg | ORAL_CAPSULE | Freq: Two times a day (BID) | ORAL | 0 refills | Status: DC

## 2022-12-09 MED ORDER — HYDROMORPHONE HCL 1 MG/ML IJ SOLN: 0.2500 mg | INTRAMUSCULAR | Status: DC | PRN

## 2022-12-09 MED ORDER — MEDROXYPROGESTERONE ACETATE 150 MG/ML IM SUSP
150.0000 mg | Freq: Once | INTRAMUSCULAR | Status: AC
  Administered 2022-12-09: 150 mg via INTRAMUSCULAR
  Filled 2022-12-09: qty 1

## 2022-12-09 MED ORDER — BUPIVACAINE HCL (PF) 0.25 % IJ SOLN
INTRAMUSCULAR | Status: AC
  Filled 2022-12-09: qty 30

## 2022-12-09 MED ORDER — MEPERIDINE HCL 25 MG/ML IJ SOLN: 6.2500 mg | INTRAMUSCULAR | Status: DC | PRN

## 2022-12-09 MED ORDER — ONDANSETRON HCL 4 MG/2ML IJ SOLN
INTRAMUSCULAR | Status: AC
  Filled 2022-12-09: qty 2

## 2022-12-09 MED ORDER — LACTATED RINGERS IV SOLN: INTRAVENOUS | Status: DC

## 2022-12-09 MED ORDER — ACETAMINOPHEN 500 MG PO TABS: 1000.0000 mg | ORAL_TABLET | Freq: Once | ORAL | Status: AC

## 2022-12-09 MED ORDER — ORAL CARE MOUTH RINSE: 15.0000 mL | Freq: Once | OROMUCOSAL | Status: AC

## 2022-12-09 MED ORDER — LIDOCAINE 2% (20 MG/ML) 5 ML SYRINGE
INTRAMUSCULAR | Status: DC | PRN
  Administered 2022-12-09: 20 mg via INTRAVENOUS

## 2022-12-09 MED ORDER — CHLORHEXIDINE GLUCONATE 0.12 % MT SOLN
OROMUCOSAL | Status: AC
  Administered 2022-12-09: 15 mL via OROMUCOSAL
  Filled 2022-12-09: qty 15

## 2022-12-09 MED ORDER — PROPOFOL 10 MG/ML IV BOLUS
INTRAVENOUS | Status: AC
  Filled 2022-12-09: qty 20

## 2022-12-09 MED ORDER — SUCCINYLCHOLINE CHLORIDE 200 MG/10ML IV SOSY
PREFILLED_SYRINGE | INTRAVENOUS | Status: AC
  Filled 2022-12-09: qty 10

## 2022-12-09 MED ORDER — SODIUM CHLORIDE 0.9 % IR SOLN
Status: DC | PRN
  Administered 2022-12-09: 3000 mL

## 2022-12-09 MED ORDER — SODIUM CHLORIDE (PF) 0.9 % IJ SOLN
INTRAMUSCULAR | Status: DC | PRN
  Administered 2022-12-09: 50 mL

## 2022-12-09 MED ORDER — ROCURONIUM BROMIDE 10 MG/ML (PF) SYRINGE
PREFILLED_SYRINGE | INTRAVENOUS | Status: AC
  Filled 2022-12-09: qty 10

## 2022-12-09 MED ORDER — MIDAZOLAM HCL 2 MG/2ML IJ SOLN
INTRAMUSCULAR | Status: AC
  Filled 2022-12-09: qty 2

## 2022-12-09 MED ORDER — PHENYLEPHRINE 80 MCG/ML (10ML) SYRINGE FOR IV PUSH (FOR BLOOD PRESSURE SUPPORT)
PREFILLED_SYRINGE | INTRAVENOUS | Status: DC | PRN
  Administered 2022-12-09: 80 ug via INTRAVENOUS

## 2022-12-09 MED ORDER — OXYCODONE HCL 5 MG PO TABS: 5.0000 mg | ORAL_TABLET | Freq: Once | ORAL | Status: DC | PRN

## 2022-12-09 MED ORDER — MIDAZOLAM HCL 2 MG/2ML IJ SOLN
INTRAMUSCULAR | Status: DC | PRN
  Administered 2022-12-09: 2 mg via INTRAVENOUS

## 2022-12-09 MED ORDER — DEXAMETHASONE SODIUM PHOSPHATE 10 MG/ML IJ SOLN
INTRAMUSCULAR | Status: AC
  Filled 2022-12-09: qty 1

## 2022-12-09 MED ORDER — SUCCINYLCHOLINE CHLORIDE 200 MG/10ML IV SOSY
PREFILLED_SYRINGE | INTRAVENOUS | Status: DC | PRN
  Administered 2022-12-09: 140 mg via INTRAVENOUS

## 2022-12-09 MED ORDER — SODIUM CHLORIDE (PF) 0.9 % IJ SOLN
INTRAMUSCULAR | Status: AC
  Filled 2022-12-09: qty 50

## 2022-12-09 MED ORDER — LIDOCAINE 2% (20 MG/ML) 5 ML SYRINGE
INTRAMUSCULAR | Status: AC
  Filled 2022-12-09: qty 5

## 2022-12-09 MED ORDER — OXYCODONE HCL 5 MG/5ML PO SOLN: 5.0000 mg | Freq: Once | ORAL | Status: DC | PRN

## 2022-12-09 MED ORDER — PROMETHAZINE HCL 25 MG/ML IJ SOLN: 6.2500 mg | INTRAMUSCULAR | Status: DC | PRN

## 2022-12-09 MED ORDER — SCOPOLAMINE 1 MG/3DAYS TD PT72: 1.0000 | MEDICATED_PATCH | TRANSDERMAL | Status: DC

## 2022-12-09 MED ORDER — IBUPROFEN 600 MG PO TABS: 600.0000 mg | ORAL_TABLET | Freq: Four times a day (QID) | ORAL | 0 refills | Status: DC | PRN

## 2022-12-09 MED ORDER — SUGAMMADEX SODIUM 200 MG/2ML IV SOLN
INTRAVENOUS | Status: DC | PRN
  Administered 2022-12-09: 200 mg via INTRAVENOUS

## 2022-12-09 MED ORDER — MIDAZOLAM HCL 2 MG/2ML IJ SOLN: 0.5000 mg | Freq: Once | INTRAMUSCULAR | Status: DC | PRN

## 2022-12-09 MED ORDER — DEXAMETHASONE SODIUM PHOSPHATE 10 MG/ML IJ SOLN
INTRAMUSCULAR | Status: DC | PRN
  Administered 2022-12-09: 10 mg via INTRAVENOUS

## 2022-12-09 MED ORDER — KETOROLAC TROMETHAMINE 30 MG/ML IJ SOLN
INTRAMUSCULAR | Status: DC | PRN
  Administered 2022-12-09: 30 mg via INTRAVENOUS

## 2022-12-09 MED ORDER — OXYCODONE-ACETAMINOPHEN 5-325 MG PO TABS: 1.0000 | ORAL_TABLET | Freq: Four times a day (QID) | ORAL | 0 refills | Status: DC | PRN

## 2022-12-09 MED ORDER — PROPOFOL 10 MG/ML IV BOLUS
INTRAVENOUS | Status: DC | PRN
  Administered 2022-12-09: 200 mg via INTRAVENOUS

## 2022-12-09 SURGICAL SUPPLY — 37 items
ADH SKN CLS APL DERMABOND .7 (GAUZE/BANDAGES/DRESSINGS) ×2
APL SRG 38 LTWT LNG FL B (MISCELLANEOUS)
APPLICATOR ARISTA FLEXITIP XL (MISCELLANEOUS) IMPLANT
DERMABOND ADVANCED .7 DNX12 (GAUZE/BANDAGES/DRESSINGS) ×3 IMPLANT
DRSG OPSITE POSTOP 3X4 (GAUZE/BANDAGES/DRESSINGS) ×1 IMPLANT
DURAPREP 26ML APPLICATOR (WOUND CARE) ×3 IMPLANT
GLOVE BIOGEL PI IND STRL 7.0 (GLOVE) ×12 IMPLANT
GLOVE ECLIPSE 6.5 STRL STRAW (GLOVE) ×3 IMPLANT
GOWN STRL REUS W/ TWL LRG LVL3 (GOWN DISPOSABLE) ×6 IMPLANT
GOWN STRL REUS W/TWL LRG LVL3 (GOWN DISPOSABLE) ×4
HEMOSTAT ARISTA ABSORB 3G PWDR (HEMOSTASIS) IMPLANT
IRRIG SUCT STRYKERFLOW 2 WTIP (MISCELLANEOUS) ×2
IRRIGATION SUCT STRKRFLW 2 WTP (MISCELLANEOUS) ×1 IMPLANT
KIT PINK PAD W/HEAD ARE REST (MISCELLANEOUS) ×2 IMPLANT
KIT PINK PAD W/HEAD ARM REST (MISCELLANEOUS) ×3 IMPLANT
KIT TURNOVER KIT B (KITS) ×3 IMPLANT
LIGASURE VESSEL 5MM BLUNT TIP (ELECTROSURGICAL) IMPLANT
NDL INSUFFLATION 14GA 120MM (NEEDLE) ×2 IMPLANT
NEEDLE INSUFFLATION 14GA 120MM (NEEDLE) ×2 IMPLANT
PACK LAPAROSCOPY BASIN (CUSTOM PROCEDURE TRAY) ×3 IMPLANT
PAD OB MATERNITY 4.3X12.25 (PERSONAL CARE ITEMS) ×3 IMPLANT
PROTECTOR NERVE ULNAR (MISCELLANEOUS) ×6 IMPLANT
SET TUBE SMOKE EVAC HIGH FLOW (TUBING) ×3 IMPLANT
SHEARS HARMONIC ACE PLUS 36CM (ENDOMECHANICALS) ×1 IMPLANT
SLEEVE Z-THREAD 5X100MM (TROCAR) ×3 IMPLANT
SOL ELECTROSURG ANTI STICK (MISCELLANEOUS) ×2
SOLUTION ELECTROSURG ANTI STCK (MISCELLANEOUS) ×3 IMPLANT
SUT MON AB 4-0 PS1 27 (SUTURE) ×3 IMPLANT
SUT VICRYL 0 UR6 27IN ABS (SUTURE) ×2 IMPLANT
SYS BAG RETRIEVAL 10MM (BASKET) ×2
SYSTEM BAG RETRIEVAL 10MM (BASKET) ×1 IMPLANT
SYSTEM CARTER THOMASON II (TROCAR) ×1 IMPLANT
TOWEL GREEN STERILE FF (TOWEL DISPOSABLE) ×6 IMPLANT
TRAY FOLEY W/BAG SLVR 14FR (SET/KITS/TRAYS/PACK) ×3 IMPLANT
TROCAR 11X100 Z THREAD (TROCAR) ×3 IMPLANT
TROCAR XCEL NON-BLD 5MMX100MML (ENDOMECHANICALS) ×3 IMPLANT
WARMER LAPAROSCOPE (MISCELLANEOUS) ×3 IMPLANT

## 2022-12-09 NOTE — MAU Note (Signed)
.  Jody Bullock is a 42 y.o. at [redacted]w[redacted]d here in MAU reporting: here for repeat hcg level. States she has been experiencing intermittent right sided abdominal pain. Still having vaginal bleeding but is not saturating pads. Patient also reports nausea for the the last two days. Took 650 mg of tylenol this morning at 0430.   Pain score: 7 Vitals:   12/09/22 0758  BP: 123/74  Pulse: 60  Resp: 18  Temp: 97.7 F (36.5 C)  SpO2: 100%      Lab orders placed from triage:  hcg

## 2022-12-09 NOTE — Transfer of Care (Signed)
Immediate Anesthesia Transfer of Care Note  Patient: Jody Bullock  Procedure(s) Performed: LAPAROSCOPIC RIGHT SALPINGECTOMY WITH REMOVAL OF ECTOPIC PREGNANCY AND EVACUATION OF HEMOPERITONEUM (Right)  Patient Location: PACU  Anesthesia Type:General  Level of Consciousness: awake, alert , and oriented  Airway & Oxygen Therapy: Patient Spontanous Breathing  Post-op Assessment: Report given to RN and Post -op Vital signs reviewed and stable  Post vital signs: Reviewed and stable  Last Vitals:  Vitals Value Taken Time  BP 132/81 12/09/22 1445  Temp    Pulse 80 12/09/22 1445  Resp 10 12/09/22 1445  SpO2 100 % 12/09/22 1445  Vitals shown include unfiled device data.  Last Pain:  Vitals:   12/09/22 1303  TempSrc:   PainSc: 0-No pain         Complications: No notable events documented.

## 2022-12-09 NOTE — Discharge Instructions (Addendum)
HOME INSTRUCTIONS  Please note any unusual or excessive bleeding, pain, swelling. Mild dizziness or drowsiness are normal for about 24 hours after surgery.   Shower when comfortable  Restrictions: No driving for 24 hours or while taking pain medications.  Activity:  No heavy lifting (> 20 lbs), nothing in vagina (no tampons, douching, or intercourse) x 4 weeks; no tub baths for 4 weeks Vaginal spotting is expected but if your bleeding is heavy, period like,  please call the office   Diet:  You may return to your regular diet.  Do not eat large meals.  Eat small frequent meals throughout the day.  Continue to drink a good amount of water at least 6-8 glasses of water per day, hydration is very important for the healing process.  Pain Management: Take ibuprofen every 6 hours for the next 3 days then you may space it out as needed.  Please alternate this medication with Percocet.  Percocet is a combination of oxycodone and tylenol.  If the Percocet is too strong, take over the counter tylenol instead.  You may also use a heating pack as needed.    Alcohol -- Avoid for 24 hours and while taking pain medications.  Nausea: Take sips of ginger ale or soda  Fever -- Call physician if temperature over 101 degrees  Follow up:  If you do not already have a follow up appointment scheduled, please call the office at [phone].  If you experience fever (a temperature greater than 100.4), pain unrelieved by pain medication, shortness of breath, swelling of a single leg, or any other symptoms which are concerning to you please the office immediately.

## 2022-12-09 NOTE — Anesthesia Postprocedure Evaluation (Signed)
Anesthesia Post Note  Patient: Jody Bullock  Procedure(s) Performed: LAPAROSCOPIC RIGHT SALPINGECTOMY WITH REMOVAL OF ECTOPIC PREGNANCY AND EVACUATION OF HEMOPERITONEUM (Right)     Patient location during evaluation: PACU Anesthesia Type: General Level of consciousness: awake and alert, patient cooperative and oriented Pain management: pain level controlled Vital Signs Assessment: post-procedure vital signs reviewed and stable Respiratory status: spontaneous breathing, nonlabored ventilation and respiratory function stable Cardiovascular status: blood pressure returned to baseline and stable Postop Assessment: no apparent nausea or vomiting and adequate PO intake Anesthetic complications: no   No notable events documented.  Last Vitals:  Vitals:   12/09/22 1500 12/09/22 1515  BP: 132/84 127/81  Pulse: 68 64  Resp: 17 15  Temp:    SpO2: 100% 100%    Last Pain:  Vitals:   12/09/22 1303  TempSrc:   PainSc: 0-No pain                 Brantley Naser,E. Wayburn Shaler

## 2022-12-09 NOTE — Anesthesia Preprocedure Evaluation (Addendum)
Anesthesia Evaluation  Patient identified by MRN, date of birth, ID band Patient awake    Reviewed: Allergy & Precautions, NPO status , Patient's Chart, lab work & pertinent test results  History of Anesthesia Complications Negative for: history of anesthetic complications  Airway Mallampati: II  TM Distance: >3 FB Neck ROM: Full    Dental  (+) Dental Advisory Given, Teeth Intact   Pulmonary neg pulmonary ROS   breath sounds clear to auscultation       Cardiovascular hypertension (2017 pre-eclampsia),  Rhythm:Regular Rate:Normal     Neuro/Psych negative neurological ROS     GI/Hepatic Neg liver ROS,,,(+)       marijuana useAbd pain   Endo/Other  BMI 36  Renal/GU negative Renal ROS     Musculoskeletal   Abdominal  (+) + obese  Peds  Hematology Hb 11.1, plt 218k   Anesthesia Other Findings   Reproductive/Obstetrics (+) Pregnancy (ectopic)                              Anesthesia Physical Anesthesia Plan  ASA: 2 and emergent  Anesthesia Plan: General   Post-op Pain Management: Tylenol PO (pre-op)*   Induction: Intravenous and Rapid sequence  PONV Risk Score and Plan: 3 and Ondansetron, Dexamethasone and Scopolamine patch - Pre-op  Airway Management Planned: Oral ETT  Additional Equipment: None  Intra-op Plan:   Post-operative Plan: Extubation in OR  Informed Consent: I have reviewed the patients History and Physical, chart, labs and discussed the procedure including the risks, benefits and alternatives for the proposed anesthesia with the patient or authorized representative who has indicated his/her understanding and acceptance.     Dental advisory given  Plan Discussed with: CRNA and Surgeon  Anesthesia Plan Comments:         Anesthesia Quick Evaluation

## 2022-12-09 NOTE — MAU Provider Note (Addendum)
History   Chief Complaint:  Vaginal Bleeding, Nausea, and Abdominal Pain   DE MINK is  42 y.o. [id number] Patient's last menstrual period was 10/19/2022 (within days).. Patient is here for follow up of quantitative HCG and ongoing surveillance of pregnancy status. She is [redacted]w[redacted]d weeks gestation by LMP.    Since her last visit, the patient is with new complaint of intermittent RT lower pelvic pain. She took Tylenol 650 mg po at ~ 0430 after getting out of the shower this morning.The patient reports bleeding as lighter than period, but not saturating a pad.    General ROS:  positive for pelvic pain and bleeding  Her previous Quantitative HCG values are:  Recent Labs  Lab 12/07/22 1316  HCGBETAQNT 16,027*    Physical Exam   Blood pressure 123/74, pulse 60, temperature 97.7 F (36.5 C), temperature source Oral, resp. rate 18, weight 87.9 kg, last menstrual period 10/19/2022, SpO2 100%.  Focused Gynecological Exam: deferred  Labs: Results for orders placed or performed during the hospital encounter of 12/09/22 (from the past 24 hour(s))  hCG, quantitative, pregnancy   Collection Time: 12/09/22  8:06 AM  Result Value Ref Range   hCG, Beta Chain, Quant, S 17,339 (H) <5 mIU/mL    Ultrasound Studies:   US OB LESS THAN 14 WEEKS WITH OB TRANSVAGINAL  Result Date: 12/09/2022 CLINICAL DATA:  Abdominal pain and vaginal bleeding in 1st trimester pregnancy. EXAM: OBSTETRIC <14 WK [place] AND TRANSVAGINAL OB US TECHNIQUE: Both transabdominal and transvaginal ultrasound examinations were performed for complete evaluation of the gestation as well as the maternal uterus, adnexal regions, and pelvic cul-de-sac. Transvaginal technique was performed to assess early pregnancy. COMPARISON:  12/07/2022 FINDINGS: Intrauterine gestational sac: None Maternal uterus/adnexae: Thickened endometrium measuring 34 mm. Right-sided uterine fibroid measuring 5.6 cm. Large amount of complex free fluid surrounds the  uterus and has increased since previous study, highly suspicious for hemoperitoneum. Neither ovary is directly visualized, however no definite adnexal mass is identified on this exam. IMPRESSION: Large amount of complex fluid, increased since previous study, and suspicious for hemoperitoneum. Although no adnexal mass is directly visualized, this is suspicious for ruptured ectopic pregnancy. Diffuse endometrial thickening.  No IUP visualized. 5.6 cm right-sided uterine fibroid. Critical Value/emergent results were called by telephone at the time of interpretation on 12/09/2022 at 10:51 am to provider Raelyn Mora , who verbally acknowledged these results. Electronically Signed   By: Danae Orleans M.D.   On: 12/09/2022 11:03   US OB LESS THAN 14 WEEKS WITH OB TRANSVAGINAL  Result Date: 12/07/2022 CLINICAL DATA:  Vaginal bleeding EXAM: OBSTETRIC <14 WK [place] AND TRANSVAGINAL OB US TECHNIQUE: Both transabdominal and transvaginal ultrasound examinations were performed for complete evaluation of the gestation as well as the maternal uterus, adnexal regions, and pelvic cul-de-sac. Transvaginal technique was performed to assess early pregnancy. COMPARISON:  None Available. FINDINGS: Intrauterine gestational sac: None Yolk sac:  Not Visualized. Embryo:  Not Visualized. Cardiac Activity: Not Visualized. Subchorionic hemorrhage:  None visualized. Maternal uterus/adnexae: Heterogeneous and thickened endometrium, measuring up to 4.6 cm. Normal appearance of the bilateral ovaries. Right-sided fibroid measuring up to 4.8 cm. IMPRESSION: 1. No intrauterine gestational sac, yolk sac, or fetal pole identified. In the setting of positive pregnancy test and no definite intrauterine pregnancy, this reflects a pregnancy of unknown location. Differential considerations include early normal IUP, abnormal IUP, or nonvisualized ectopic pregnancy. Differentiation is achieved with serial beta HCG supplemented by repeat sonography as  clinically warranted. 2. Heterogeneous and  thickened endometrium. Electronically Signed   By: Allegra Lai M.D.   On: 12/07/2022 16:23    Assessment:   1. Ruptured left tubal ectopic pregnancy causing hemoperitoneum      Plan: -Prep for OR -NPO since midnight -Dr. Charlotta Newton assumes care of patient at 1130 -See Dr. Lawana Chambers H&P documentation   Raelyn Mora, CNM 12/09/2022, 8:41 AM

## 2022-12-09 NOTE — Op Note (Signed)
PREOPERATIVE DIAGNOSIS:  1) Ruptured ectopic pregnancy POSTOPERATIVE DIAGNOSIS: same PROCEDURE PERFORMED: Diagnostic laparoscopy, evacuation of hemoperitoneum, right salpingectomy with removal of ectopic SURGEON: Dr. Myna Hidalgo ASSISTANT: none ANESTHESIA: General endotracheal.  ESTIMATED BLOOD LOSS: 450cc of hemoperitoneum, 5cc EBL.  URINE OUTPUT: 250ccc of clear yellow urine at the end of the procedure.  IV FLUIDS: 1200cc of crystalloid.  SPECIMEN(S): Right fallopian tube with ectopic COMPLICATIONS: None.  CONDITION: Stable.  FINDINGS: No ascites or peritoneal studding was appreciated.  Liver, gallbladder and bowel appeared grossly normal.  Uterus normal size and shape.  Normal left fallopian tube and ovary.  Normal right ovary.  Abnormal right tube with ectopic pregnancy.  Informed consent was obtained from the patient prior to taking her to the operating room where anesthesia was found to be adequate. She was placed in dorsal lithotomy position and examined under anesthesia. She was prepped and draped in normal sterile fashion. The bladder was catheterized with a foley under sterile technique.  A bi-valve speculum was then placed and the anterior lip of the cervix was grasped with the single tooth tenaculum. The uterine manipulator was then advanced into the uterus to provide uterine mobility. The speculum and tenaculum were then removed.  Attention was then turned to the patients abdomen where a 10 mm infraumbilical skin incision was made with the scalpel. The veress needle was carefully introduced into the peritoneal cavity while tenting the abdominal wall. Intraperitoneal placement was confirmed by use of a saline-drop test.  The gas was connected and confirmed intrabdominal placement by a low initial pressure of . The abdomen was then insuflated with CO2 gas. The trocar and sleeve were then advanced without difficulty into the abdomen under direct visualization.  Intraabdominal  placement could not be confirmed and the trocar was removed.   Abdomen already appeared to be insufflated.  Under direct visualization, trocar was placed at palmer's point.  Intraabdominal placement was confirmed by the laparoscope and surveillance of the abdomen was performed. Grossly normal appearing abdomen as mentioned in findings above. 10mm trocar was inserted into the umbilicus.  An additional left lower quadrant incision and trocar was placed under direct visualization.    Evacuation of the hemoperitoneum was completed.  Right adnexa was examined and large right ectopic pregnancy with abnormal right tube seen.  Attention turned to the left adenxa- normal appearing tube and ovary.  Using the harmonic, the right fallopian tube with ectopic was excised.  The endocatch bag was then placed through the 10 mm port and the specimens were removed in its entirety. Re-examination of the uterus and abdominal cavity confirmed hemostasis. Additional hemoperitoneum was evacuated.  A Carter-Thompson was then inserted into the umbilical port site for closure of the fascia. The fascia was closed using 0-vicryl without difficulty under direct visualization. The instruments were then removed from the patients abdomen with air allowed to fully escape. The port sites were then closed with monocryl. The manipulator was removed from the cervix with no lacerations or bleeding identified. The patient tolerated the procedure well with all sponge, lap, and needle counts correct. The patient was taken to recovery in stable condition.   Myna Hidalgo, DO Attending Obstetrician & Gynecologist, [facility] for [employer], [facility]

## 2022-12-09 NOTE — Anesthesia Procedure Notes (Signed)
Procedure Name: Intubation Date/Time: 12/09/2022 1:18 PM  Performed by: Randon Goldsmith, CRNAPre-anesthesia Checklist: Patient identified, Emergency Drugs available, Suction available and Patient being monitored Patient Re-evaluated:Patient Re-evaluated prior to induction Oxygen Delivery Method: Circle system utilized Preoxygenation: Pre-oxygenation with 100% oxygen Induction Type: IV induction, Rapid sequence and Cricoid Pressure applied Laryngoscope Size: Mac and 3 Grade View: Grade II Tube type: Oral Tube size: 7.0 mm Number of attempts: 1 Airway Equipment and Method: Stylet and Oral airway Placement Confirmation: ETT inserted through vocal cords under direct vision, positive ETCO2 and breath sounds checked- equal and bilateral Secured at: 21 cm Tube secured with: Tape Dental Injury: Teeth and Oropharynx as per pre-operative assessment

## 2022-12-09 NOTE — H&P (Signed)
Faculty Practice Obstetrics and Gynecology Attending History and Physical  Jody Bullock is a 42 y.o. [id number] at [redacted]w[redacted]d who presents for follow up for pregnancy of unknown location.  Presented today for f/u hcg and [place].  [place] today notes evidence of hemoperitoneum suggestive of ruptured ectopic.  She does not considerable pain on her right side, more noticeable with ambulation.  Some nausea, no vomiting.  She also note light vaginal spotting.  No fever/chills.  No other acute complaints.   Past Medical History:  Diagnosis Date   Eczema    Environmental allergies    Hypertension    Pre eclampsia after birth of son in 2017   Past Surgical History:  Procedure Laterality Date   NO PAST SURGERIES     OB History  Gravida Para Term Preterm AB Living  4 2 1 1 1 2   SAB IAB Ectopic Multiple Live Births  1 0 0 0 2    # Outcome Date GA Lbr Len/2nd Weight Sex Type Anes PTL Lv  4 Current           3 Preterm 09/09/15 [redacted]w[redacted]d 08:20 / 00:22 3025 g M Vag-Spont EPI  LIV     Birth Comments: preterm  2 SAB 2014 [redacted]w[redacted]d    SAB     1 Term 05/02/09 [redacted]w[redacted]d  3345 g M Vag-Spont EPI N LIV  Patient denies any other pertinent gynecologic issues.  No current facility-administered medications on file prior to encounter.   Current Outpatient Medications on File Prior to Encounter  Medication Sig Dispense Refill   Acetaminophen-guaiFENesin (MUCINEX COLD & FLU PO) Take by mouth.     Ferrous Sulfate (IRON PO) Take by mouth.     fluticasone (FLONASE) 50 MCG/ACT nasal spray Place 2 sprays into both nostrils daily. 16 g 11   levocetirizine (XYZAL) 5 MG tablet Take 1 tablet (5 mg total) by mouth every evening. 30 tablet 11   triamcinolone cream (KENALOG) 0.1 % APPLY TOPICALLY TO THE AFFECTED AREA TWICE DAILY AS NEEDED 80 g 1   No Known Allergies  Social History:   reports that she has never smoked. She has never been exposed to tobacco smoke. She has never used smokeless tobacco. She reports that she does not currently  use alcohol. She reports current drug use. Drug: Marijuana. Family History  Problem Relation Age of Onset   Gallbladder disease Mother    Cancer Mother        Dysplastic endometrial cells--total hysterectomy.   Cancer Father        prostate    Anxiety disorder Father    Heart disease Father    Hyperlipidemia Father    Breast cancer Maternal Grandmother 27 - 70   Heart disease Brother        Died of AMI    Review of Systems: Pertinent items noted in HPI and remainder of comprehensive ROS otherwise negative.  PHYSICAL EXAM: Blood pressure 123/74, pulse 60, temperature 97.7 F (36.5 C), temperature source Oral, resp. rate 18, weight 87.9 kg, last menstrual period 10/19/2022, SpO2 100%. CONSTITUTIONAL: Well-developed, well-nourished female in no acute distress.  SKIN: Skin is warm and dry. Not diaphoretic. No erythema. No pallor. NEUROLOGIC: Alert and oriented to person, place, and time. Normal reflexes, muscle tone coordination. No cranial nerve deficit noted. PSYCHIATRIC: Normal mood and affect. Normal behavior. Normal judgment and thought content. CARDIOVASCULAR: Normal heart rate noted, regular rhythm RESPIRATORY: Effort and breath sounds normal, no problems with respiration noted ABDOMEN: Soft, +RLQ tenderness,  no rebound, no guarding PELVIC: deferred MUSCULOSKELETAL: no calf tenderness bilaterally EXT: no edema bilaterally, normal pulses  Labs:  Results for orders placed or performed during the hospital encounter of 12/09/22 (from the past 24 hour(s))  hCG, quantitative, pregnancy     Status: Abnormal   Collection Time: 12/09/22  8:06 AM  Result Value Ref Range   hCG, Beta Chain, Quant, S 17,339 (H) <5 mIU/mL     Imaging Studies: US OB LESS THAN 14 WEEKS WITH OB TRANSVAGINAL  Result Date: 12/09/2022 CLINICAL DATA:  Abdominal pain and vaginal bleeding in 1st trimester pregnancy. EXAM: OBSTETRIC <14 WK [place] AND TRANSVAGINAL OB US TECHNIQUE: Both transabdominal and  transvaginal ultrasound examinations were performed for complete evaluation of the gestation as well as the maternal uterus, adnexal regions, and pelvic cul-de-sac. Transvaginal technique was performed to assess early pregnancy. COMPARISON:  12/07/2022 FINDINGS: Intrauterine gestational sac: None Maternal uterus/adnexae: Thickened endometrium measuring 34 mm. Right-sided uterine fibroid measuring 5.6 cm. Large amount of complex free fluid surrounds the uterus and has increased since previous study, highly suspicious for hemoperitoneum. Neither ovary is directly visualized, however no definite adnexal mass is identified on this exam. IMPRESSION: Large amount of complex fluid, increased since previous study, and suspicious for hemoperitoneum. Although no adnexal mass is directly visualized, this is suspicious for ruptured ectopic pregnancy. Diffuse endometrial thickening.  No IUP visualized. 5.6 cm right-sided uterine fibroid. Critical Value/emergent results were called by telephone at the time of interpretation on 12/09/2022 at 10:51 am to provider Raelyn Mora , who verbally acknowledged these results. Electronically Signed   By: Danae Orleans M.D.   On: 12/09/2022 11:03   US OB LESS THAN 14 WEEKS WITH OB TRANSVAGINAL  Result Date: 12/07/2022 CLINICAL DATA:  Vaginal bleeding EXAM: OBSTETRIC <14 WK [place] AND TRANSVAGINAL OB US TECHNIQUE: Both transabdominal and transvaginal ultrasound examinations were performed for complete evaluation of the gestation as well as the maternal uterus, adnexal regions, and pelvic cul-de-sac. Transvaginal technique was performed to assess early pregnancy. COMPARISON:  None Available. FINDINGS: Intrauterine gestational sac: None Yolk sac:  Not Visualized. Embryo:  Not Visualized. Cardiac Activity: Not Visualized. Subchorionic hemorrhage:  None visualized. Maternal uterus/adnexae: Heterogeneous and thickened endometrium, measuring up to 4.6 cm. Normal appearance of the bilateral ovaries.  Right-sided fibroid measuring up to 4.8 cm. IMPRESSION: 1. No intrauterine gestational sac, yolk sac, or fetal pole identified. In the setting of positive pregnancy test and no definite intrauterine pregnancy, this reflects a pregnancy of unknown location. Differential considerations include early normal IUP, abnormal IUP, or nonvisualized ectopic pregnancy. Differentiation is achieved with serial beta HCG supplemented by repeat sonography as clinically warranted. 2. Heterogeneous and thickened endometrium. Electronically Signed   By: Allegra Lai M.D.   On: 12/07/2022 16:23    Assessment: Ruptured ectopic pregnancy   Plan: -Plan to proceed with diagnostic laparoscopy, evacuation of hemoperitoneum, removal of ectopic and possible unilateral salpingectomy -CBC, T&S ordered -NPO -LR @ 125cc/hr -SCDs to OR -Risk/benefits and alternatives reviewed with the patient including but not limited to risk of bleeding, infection and injury to surrounding organs.  Questions and concerns were addressed and pt desires to proceed.  OR notified and plan to proceed when ready  Myna Hidalgo, DO Attending Obstetrician & Gynecologist, [facility] for [employer], [facility]

## 2022-12-10 ENCOUNTER — Encounter (HOSPITAL_COMMUNITY): Payer: Self-pay | Admitting: Obstetrics & Gynecology

## 2022-12-12 ENCOUNTER — Encounter: Payer: Self-pay | Admitting: Internal Medicine

## 2022-12-12 LAB — SURGICAL PATHOLOGY

## 2022-12-13 ENCOUNTER — Telehealth: Payer: Self-pay

## 2022-12-13 NOTE — Telephone Encounter (Signed)
Called patient stating I am trying to reach her to return her phone call. Patient states she had surgery over the weekend and has a few questions about what is normal. She reports bleeding a little lighter than a period but isn't heavy and wants to know if it is normal to be bleeding still. Discussed with patient that is normal as her body is still healing and processing the recent pregnancy. Advised heavy bleeding saturating a pad in less than an hour would not be normal. Patient asked about her incision having a slight smell to it- denies foul smell. Discussed with patient it likely is due to sweat and possible mild drainage from healing but infection usually smells foul. Advised she could remove the gauze gently if she wanted to so she could clean it well. Reviewed wound care instructions. Patient verbalized understanding and states she may have her aunt do that. Patient states she finally had a bowel movement this morning and is feeling a little better since then but thinks she may have had a boil between her legs but it has burst. Told patient if it opened and is draining then it should resolve on its own. Also reviewed smooth move tea, increased hydration and movement, or stool softeners as needed for constipation. Advised she call back with any questions/concerns. Patient verbalized understanding and will follow up on Friday.

## 2022-12-13 NOTE — Telephone Encounter (Signed)
Pt called requesting a call back because she had surgery on Saturday and has a couple of questions.   Leonette Nutting  12/13/22

## 2022-12-15 ENCOUNTER — Other Ambulatory Visit: Payer: Self-pay

## 2022-12-15 ENCOUNTER — Ambulatory Visit (INDEPENDENT_AMBULATORY_CARE_PROVIDER_SITE_OTHER): Payer: Medicaid Other | Admitting: General Practice

## 2022-12-15 VITALS — BP 131/79 | HR 56 | Ht 61.0 in | Wt 195.0 lb

## 2022-12-15 DIAGNOSIS — Z5189 Encounter for other specified aftercare: Secondary | ICD-10-CM

## 2022-12-15 NOTE — Progress Notes (Signed)
Patient presents to office today for wound check following salpingectomy for ruptured ectopic on 7/27. She reports doing well since then. Honeycomb dressing removed. All incision sites appear to be healing well- are clean, dry & intact. Reviewed wound care. Patient will return to office for post op visit.   Chase Caller RN BSN 12/15/22

## 2023-01-17 ENCOUNTER — Other Ambulatory Visit: Payer: Self-pay

## 2023-01-17 ENCOUNTER — Encounter: Payer: Self-pay | Admitting: Obstetrics and Gynecology

## 2023-01-17 ENCOUNTER — Ambulatory Visit (INDEPENDENT_AMBULATORY_CARE_PROVIDER_SITE_OTHER): Payer: Medicaid Other | Admitting: Obstetrics and Gynecology

## 2023-01-17 VITALS — BP 137/86 | HR 96 | Ht 61.0 in | Wt 192.0 lb

## 2023-01-17 DIAGNOSIS — Z4889 Encounter for other specified surgical aftercare: Secondary | ICD-10-CM

## 2023-01-17 NOTE — Progress Notes (Signed)
    Subjective:    Jody Bullock is a 42 y.o. female who presents to the clinic status post operative laparoscopy with right salpingectomy on 12/09/22. The patient is not having any pain.  Eating a regular diet without difficulty. Bowel movements are normal. No other significant postoperative concerns.  The following portions of the patient's history were reviewed and updated as appropriate: allergies, current medications, past family history, past medical history, past social history, past surgical history, and problem list..  Last pap smear was normal  on 2022.  Review of Systems Pertinent items are noted in HPI.   Objective:   BP 137/86   Pulse 96   Ht 5\' 1"  (1.549 m)   Wt 192 lb (87.1 kg)   LMP 10/19/2022 (Within Days)   Breastfeeding Unknown   BMI 36.28 kg/m  Constitutional:  Well-developed, well-nourished female in no acute distress.   Skin: Skin is warm and dry, no rash noted, not diaphoretic,no erythema, no pallor.  Cardiovascular: Normal heart rate noted  Respiratory: Effort and breath sounds normal, no problems with respiration noted  Abdomen: Soft, bowel sounds active, non-tender, no abnormal masses  Incision: Healing well, no drainage, no erythema, no hernia, no seroma, no swelling, no dehiscence, incision well approximated  Pelvic:   Deferred   Surgical pathology () Right ectopic pregnancy confirmed Assessment:   Doing well postoperatively.  Operative findings again reviewed. Pathology report discussed.   Plan:   1. Continue any current medications. 2. Wound care discussed. 3. Activity restrictions: none 4. Anticipated return to work: now. 5. Follow up as needed, pt will get next depo shot at nurse visit Oct 12-26 6.  Routine preventative health maintenance measures emphasized. Please refer to After Visit Summary for other counseling recommendations.    Mariel Aloe, MD, FACOG Attending Obstetrician & Gynecologist Center for [facility], [facility]

## 2023-02-16 ENCOUNTER — Encounter: Payer: Medicaid Other | Admitting: Internal Medicine

## 2023-02-26 ENCOUNTER — Ambulatory Visit: Payer: Medicaid Other

## 2023-02-26 ENCOUNTER — Telehealth: Payer: Self-pay

## 2023-02-26 ENCOUNTER — Other Ambulatory Visit: Payer: Self-pay

## 2023-02-26 NOTE — Telephone Encounter (Signed)
Office currently out of Depo Provera. Called pt to move appt time to later in day after shipment has arrived. VM left and front office notified to reschedule if patient calls back or presents for appt.

## 2023-02-27 ENCOUNTER — Ambulatory Visit (INDEPENDENT_AMBULATORY_CARE_PROVIDER_SITE_OTHER): Payer: Medicaid Other

## 2023-02-27 VITALS — BP 147/93 | HR 143 | Ht 61.0 in | Wt 197.1 lb

## 2023-02-27 DIAGNOSIS — Z3042 Encounter for surveillance of injectable contraceptive: Secondary | ICD-10-CM | POA: Diagnosis not present

## 2023-02-27 MED ORDER — MEDROXYPROGESTERONE ACETATE 150 MG/ML IM SUSY
150.0000 mg | PREFILLED_SYRINGE | Freq: Once | INTRAMUSCULAR | Status: AC
Start: 2023-02-27 — End: 2023-02-27
  Administered 2023-02-27: 150 mg via INTRAMUSCULAR

## 2023-02-27 NOTE — Progress Notes (Signed)
[name] here for Depo-Provera Injection. Injection administered without complication. Patient will return in 3 months for next injection between December 31 and January 14. Next annual visit due now.  Pt advised to schedule annual with front office with any provider and to f/u with her PCP about her elevated BP.  Pt verbalized understanding with no further questions.   Ralene Bathe, RN 02/27/2023  9:17 AM

## 2023-04-10 ENCOUNTER — Ambulatory Visit: Payer: Medicaid Other | Admitting: Obstetrics and Gynecology

## 2023-05-07 ENCOUNTER — Other Ambulatory Visit: Payer: Self-pay

## 2023-05-07 ENCOUNTER — Encounter: Payer: Self-pay | Admitting: Obstetrics and Gynecology

## 2023-05-07 ENCOUNTER — Ambulatory Visit: Payer: Medicaid Other | Admitting: Obstetrics and Gynecology

## 2023-05-07 VITALS — BP 141/87 | HR 52 | Ht 61.0 in | Wt 196.3 lb

## 2023-05-07 DIAGNOSIS — Z01419 Encounter for gynecological examination (general) (routine) without abnormal findings: Secondary | ICD-10-CM | POA: Diagnosis not present

## 2023-05-07 NOTE — Progress Notes (Signed)
Mammogram scheduled for 05/17/2023 @ 9:40a

## 2023-05-07 NOTE — Progress Notes (Signed)
ANNUAL EXAM Patient name: Jody Bullock MRN [id number]  Date of birth: [birth year] Chief Complaint:   Gynecologic Exam  History of Present Illness:   Jody Bullock is a 42 y.o. [id number] African-American female being seen today for a routine annual exam.  Current complaints: irregular spotting since starting Depo in July  No LMP recorded. Patient has had an injection.   The pregnancy intention screening data noted above was reviewed. Potential methods of contraception were discussed. The patient elected to proceed with No data recorded.   Last pap 12/2020. Results were: NILM w/ HRHPV negative. H/O abnormal pap: no Last mammogram: 03/2022. Results were: normal. Family h/o breast cancer: yes MGM 80s    Mammogram scheduled for 05/17/2023 @ 9:40a   Last colonoscopy: Not yet. Family h/o colorectal cancer: no     05/07/2023   11:45 AM 01/17/2023   11:18 AM 02/06/2019    8:38 AM 04/01/2015    1:37 PM  Depression screen PHQ 2/9  Decreased Interest 0 0 0 0  Down, Depressed, Hopeless 0 0 0 2  PHQ - 2 Score 0 0 0 2  Altered sleeping 1 2 1 2   Tired, decreased energy 2 1 1 3   Change in appetite 0 0 0 3  Feeling bad or failure about yourself  0 0 0 0  Trouble concentrating 0 0 0   Moving slowly or fidgety/restless 0 0 0 0  Suicidal thoughts 0 0 0 0  PHQ-9 Score 3 3 2 10   Difficult doing work/chores   Not difficult at all Not difficult at all        05/07/2023   11:45 AM 01/17/2023   11:18 AM 02/06/2019    8:39 AM  GAD 7 : Generalized Anxiety Score  Nervous, Anxious, on Edge 2 0 1  Control/stop worrying 1  0  Worry too much - different things 1  0  Trouble relaxing 1  1  Restless 1  0  Easily annoyed or irritable 2  1  Afraid - awful might happen 0  0  Total GAD 7 Score 8  3     Review of Systems:   Pertinent items are noted in HPI Denies any headaches, blurred vision, fatigue, shortness of breath, chest pain, abdominal pain, abnormal vaginal  discharge/itching/odor/irritation, problems with periods, bowel movements, urination, or intercourse unless otherwise stated above. Pertinent History Reviewed:  Reviewed past medical,surgical, social and family history.  Reviewed problem list, medications and allergies. Physical Assessment:   Vitals:   05/07/23 1144  BP: (!) 141/87  Pulse: (!) 52  Weight: 196 lb 4.8 oz (89 kg)  Height: 5\' 1"  (1.549 m)  Body mass index is 37.09 kg/m.        Physical Examination:   General appearance - well appearing, and in no distress  Mental status - alert, oriented to person, place, and time  Psych:  She has a normal mood and affect  Skin - warm and dry, normal color, no suspicious lesions noted  Heart - normal rate and regular rhythm  Neck:  midline trachea, no thyromegaly or nodules  Breasts - breasts appear normal, no suspicious masses, no skin or nipple changes or axillary nodes  Abdomen - soft, nontender, nondistended, no masses or organomegaly  Extremities:  No swelling or varicosities noted  Chaperone present for exam  No results found for this or any previous visit (from the past 24 hours).  Assessment & Plan:  1) Well-Woman Exam  2) Elevated BP  without Dx; hx of preeclampsia with last pregnancy - Recommended follow up with PCP; pt reports hx of similarly elevated initial pressures at most medical visits, that improve on repeat.  Labs/procedures today: HCV, HIV, RPR,   Mammogram: schedule screening mammo as soon as possible, or sooner if problems Colonoscopy: schedule screening colonoscopy as soon as possible, or sooner if problems  Orders Placed This Encounter  Procedures   MM 3D SCREENING MAMMOGRAM BILATERAL BREAST   RPR+HBsAg+HCVAb+...    Meds: No orders of the defined types were placed in this encounter.   Follow-up: Return in about 1 year (around 05/06/2024).  Wyn Forster, MD 05/07/2023 1:15 PM

## 2023-05-08 LAB — RPR+HBSAG+HCVAB+...
HIV Screen 4th Generation wRfx: NONREACTIVE
Hep C Virus Ab: NONREACTIVE
Hepatitis B Surface Ag: NEGATIVE
RPR Ser Ql: NONREACTIVE

## 2023-05-15 ENCOUNTER — Ambulatory Visit: Payer: Medicaid Other

## 2023-05-15 ENCOUNTER — Other Ambulatory Visit: Payer: Self-pay

## 2023-05-15 VITALS — BP 123/85 | HR 64 | Ht 61.0 in | Wt 197.0 lb

## 2023-05-15 DIAGNOSIS — Z3042 Encounter for surveillance of injectable contraceptive: Secondary | ICD-10-CM

## 2023-05-15 MED ORDER — MEDROXYPROGESTERONE ACETATE 150 MG/ML IM SUSY
150.0000 mg | PREFILLED_SYRINGE | Freq: Once | INTRAMUSCULAR | Status: AC
Start: 2023-05-15 — End: 2023-05-15
  Administered 2023-05-15: 150 mg via INTRAMUSCULAR

## 2023-05-15 NOTE — Progress Notes (Signed)
 Jody Bullock here for Depo-Provera Injection. Injection administered without complication. Patient will return in 3 months for next injection between 3/18 and 4/1. Next annual visit due 04/2023.   Jody Reichert, RN 05/15/2023  8:53 AM

## 2023-05-17 ENCOUNTER — Ambulatory Visit
Admission: RE | Admit: 2023-05-17 | Discharge: 2023-05-17 | Disposition: A | Discharge status: Home / Self Care | Payer: Medicaid Other | Source: Ambulatory Visit | Attending: Obstetrics and Gynecology | Admitting: Obstetrics and Gynecology

## 2023-05-17 DIAGNOSIS — Z01419 Encounter for gynecological examination (general) (routine) without abnormal findings: Secondary | ICD-10-CM

## 2023-05-30 ENCOUNTER — Ambulatory Visit (INDEPENDENT_AMBULATORY_CARE_PROVIDER_SITE_OTHER): Payer: Medicaid Other | Admitting: Internal Medicine

## 2023-05-30 ENCOUNTER — Encounter: Payer: Self-pay | Admitting: Internal Medicine

## 2023-05-30 VITALS — BP 130/80 | HR 64 | Resp 16 | Ht 62.0 in | Wt 202.0 lb

## 2023-05-30 DIAGNOSIS — E6609 Other obesity due to excess calories: Secondary | ICD-10-CM

## 2023-05-30 DIAGNOSIS — Z23 Encounter for immunization: Secondary | ICD-10-CM | POA: Diagnosis not present

## 2023-05-30 DIAGNOSIS — Z0000 Encounter for general adult medical examination without abnormal findings: Secondary | ICD-10-CM

## 2023-05-30 DIAGNOSIS — E66811 Obesity, class 1: Secondary | ICD-10-CM

## 2023-05-30 DIAGNOSIS — F439 Reaction to severe stress, unspecified: Secondary | ICD-10-CM | POA: Diagnosis not present

## 2023-05-30 DIAGNOSIS — Z6834 Body mass index (BMI) 34.0-34.9, adult: Secondary | ICD-10-CM

## 2023-05-30 DIAGNOSIS — D649 Anemia, unspecified: Secondary | ICD-10-CM

## 2023-05-30 NOTE — Patient Instructions (Addendum)
 Ask about IUD for contraception--copper or progesterone  Drink a glass of water before every meal Drink 6-8 glasses of water daily Eat three meals daily Eat a protein and healthy fat with every meal (eggs,fish, chicken, [place] and limit red meats) Eat 5 servings of vegetables daily, mix the colors Eat 2 servings of fruit daily with skin, if skin is edible Use smaller plates Put food/utensils down as you chew and swallow each bite Eat at a table with friends/family at least once daily, no TV Do not eat in front of the TV  Recent studies show that people who consume all of their calories in a 12 hour period lose weight more efficiently.  For example, if you eat your first meal at 7:00 a.m., your last meal of the day should be completed by 7:00 p.m.

## 2023-05-30 NOTE — Progress Notes (Signed)
 Subjective:    Patient ID: Jody Bullock, female   DOB: 11-02-1980, 43 y.o.   MRN: [id number]   HPI  CPE without pap  1.  Pap:  Last 12/2020 and normal.  Does give history that she had right salpingectomy for ectopic pregnancy.  She had Dep provera  given subsequently.  She has had 3 separate shots, last 05/15/2023.  Is having constant bleeding/spotting for 4-5 months.    2.  Mammogram:  Performed 05/17/2023 and normal.  Maternal grandmother successfully treated for breast cancer at age 60.  Died from other causes.    3.  Osteoprevention:  Does not eat or drink dairy, but considering almond milk.  Not physically active since cooler.    4.  Guaiac Cards/FIT:    Did not return last year.  5.  Colonoscopy:  Never.  No family history of colon cancer.    6.  Immunizations:  Has not had influenza or COVID booster.   Immunization History  Administered Date(s) Administered   Hpv-Unspecified 10/29/2007, 12/23/2007, 04/29/2008   Influenza Inj Mdck Quad Pf 03/25/2019   Influenza,inj,Quad PF,6+ Mos 03/03/2013, 02/15/2022   PFIZER(Purple Top)SARS-COV-2 Vaccination 08/01/2019, 08/26/2019   Td 10/29/2007   Tdap 09/10/2015     7.  Glucose/Cholesterol:  Glucose and cholesterol have been fine in past.   The latter has not been checked since 2022.    Current Meds  Medication Sig   levocetirizine (XYZAL ) 5 MG tablet Take 1 tablet (5 mg total) by mouth every evening.   medroxyPROGESTERone  (DEPO-PROVERA ) 150 MG/ML injection Inject 150 mg into the muscle every 3 (three) months.   triamcinolone  cream (KENALOG ) 0.1 % APPLY TOPICALLY TO THE AFFECTED AREA TWICE DAILY AS NEEDED   No Known Allergies  Past Medical History:  Diagnosis Date   At high risk for complications of intrauterine pregnancy (IUP) 09/05/2012   Eczema    Encounter for supervision of other normal pregnancy in first trimester 04/01/2015   Environmental allergies    Hemoperitoneum due to rupture of right tubal ectopic  pregnancy 12/09/2022   Miscarriage 09/03/2012   NVD (normal vaginal delivery) 09/09/2015   Preeclampsia in postpartum period    Pre eclampsia after birth of son in 2017   Preterm premature rupture of membranes (PPROM) with unknown onset of labor 09/08/2015   Right tubal pregnancy without intrauterine pregnancy 12/09/2022   Past Surgical History:  Procedure Laterality Date   LAPAROSCOPIC UNILATERAL SALPINGECTOMY Right 12/09/2022   Procedure: LAPAROSCOPIC RIGHT SALPINGECTOMY WITH REMOVAL OF ECTOPIC PREGNANCY AND EVACUATION OF HEMOPERITONEUM;  Surgeon: Ozan, Jennifer, DO;  Location: [facility] OR;  Service: Gynecology;  Laterality: Right;   Family History  Problem Relation Age of Onset   Alcohol abuse Mother        Denies alcohol abuse, but history of drinking and driving.   Cancer Mother        Dysplastic endometrial cells--total hysterectomy.   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   Father of sons  involved--6 years younger and a good dad.     He is supportive.   Jody Bullock is her aunt   Her current female partner lives in [place]   Social Drivers of Health   Financial Resource Strain: Low Risk  (05/30/2023)   Overall Financial Resource Strain (CARDIA)    Difficulty of Paying Living Expenses: Not hard at all  Food Insecurity: No Food Insecurity (05/30/2023)   Hunger Vital Sign    Worried About Running Out of Food in the Last Year: Never true    Ran Out of Food in the Last Year: Never true  Transportation Needs: No Transportation Needs (05/30/2023)   PRAPARE - Administrator, Civil Service (Medical): No    Lack of Transportation (Non-Medical): No  Physical Activity: Unknown (02/05/2019)   Exercise Vital Sign    Days of Exercise per Week: 7 days    Minutes of Exercise per Session: Not on file  Stress: Not on file  Social Connections: Unknown (02/05/2019)   Social Connection and Isolation Panel [NHANES]    Frequency of Communication with Friends and Family: Not on file    Frequency of Social Gatherings with Friends and Family: Not on file    Attends Religious Services: Not on file    Active Member of Clubs or Organizations: Not on file    Attends Banker Meetings: Not on file    Marital Status: Never married  Intimate Partner Violence: Not At Risk (05/30/2023)   Humiliation, Afraid, Rape, and Kick questionnaire    Fear of Current or Ex-Partner: No    Emotionally Abused: No    Physically Abused: No    Sexually Abused: No      Review of Systems  Eyes:  Negative for visual disturbance (wears glasses.).  Respiratory:  Negative for shortness of breath.   Cardiovascular:  Negative for chest pain, palpitations and leg swelling.  Gastrointestinal:  Negative for abdominal pain and blood in stool (no melena).  Neurological:        Headache in left frontal area-sharp.  No throbbing. Noted if sleeps or closes eyes or takes Tylenol , goes away..   Drank Dr. Kathlene Bullock  for 3 days and then just stopped.   She no longer has headache, but noted in intervening days, every times she drank Dr. Kathlene Bullock, she would have a headache.      Objective:   BP 130/80 (BP Location: Left Arm, Patient Position: Sitting, Cuff Size: Large)   Pulse 64   Resp 16   Ht 5\' 2"  (1.575 m)   Wt 202 lb (91.6 kg)   BMI 36.95 kg/m   Physical Exam HENT:     Head: Normocephalic and atraumatic.     Right Ear:  Tympanic membrane, ear canal and external ear normal.     Left Ear: Tympanic membrane, ear canal and external ear normal.     Nose: Nose normal.     Mouth/Throat:     Mouth: Mucous membranes are moist.     Pharynx: Oropharynx is clear.  Eyes:     Extraocular Movements: Extraocular movements intact.     Conjunctiva/sclera: Conjunctivae normal.     Pupils: Pupils are equal, round, and reactive to light.     Comments: Discs sharp  Cardiovascular:     Rate and Rhythm: Normal rate and regular rhythm.     Heart sounds: No murmur heard.    No friction rub.  Pulmonary:     Effort: Pulmonary effort is normal.     Breath sounds: Normal breath sounds.  Chest:     Comments: Deferred to gynecology-done earlier this year. Abdominal:     General: Bowel sounds are normal.     Palpations: Abdomen is soft. There is no hepatomegaly, splenomegaly or mass.     Tenderness: There is no abdominal tenderness.     Hernia: No hernia is present.     Comments: Well healed laparoscopic surgical scars  Genitourinary:    Comments: Deferred to gynecology--done previously Musculoskeletal:        General: Normal range of motion.     Cervical back: Normal range of motion and neck supple.  Lymphadenopathy:     Head:     Right side of head: No submental or submandibular adenopathy.     Left side of head: No submental or submandibular adenopathy.     Cervical: No cervical adenopathy.     Upper Body:     Right upper body: No supraclavicular or axillary adenopathy.     Left upper body: No  supraclavicular or axillary adenopathy.     Lower Body: No right inguinal adenopathy. No left inguinal adenopathy.  Skin:    General: Skin is warm.     Findings: No lesion or rash.  Neurological:     Mental Status: She is alert.      Assessment & Plan   CPE without pap--repeat next CPE Mammogram complete and normal Return FIT with fasting labs in 1 week.  FLP, CBC, CMP Influenza vaccine Encouraged finding COVID at pharmacy of choice.  2.  Obesity:  encouraged working on lifestyle changes.  TSH  3.  Stress:  referral to Sara Beth Trogdon, LCSWA for counseling.    4.  Chronic spotting with Depo Provera .  To check with gynecology on [facility] (? IUD) alternatives as spotting almost daily for 4-5 months.  CBC with fasting labs and history of anemia.

## 2023-06-07 ENCOUNTER — Other Ambulatory Visit: Payer: Medicaid Other

## 2023-06-07 DIAGNOSIS — Z0000 Encounter for general adult medical examination without abnormal findings: Secondary | ICD-10-CM

## 2023-06-07 DIAGNOSIS — D649 Anemia, unspecified: Secondary | ICD-10-CM | POA: Diagnosis not present

## 2023-06-08 ENCOUNTER — Encounter: Payer: Self-pay | Admitting: Internal Medicine

## 2023-06-08 LAB — CBC WITH DIFFERENTIAL/PLATELET
Basophils Absolute: 0.1 10*3/uL (ref 0.0–0.2)
Basos: 1 %
EOS (ABSOLUTE): 0.2 10*3/uL (ref 0.0–0.4)
Eos: 4 %
Hematocrit: 37.2 % (ref 34.0–46.6)
Hemoglobin: 11.7 g/dL (ref 11.1–15.9)
Immature Grans (Abs): 0 10*3/uL (ref 0.0–0.1)
Immature Granulocytes: 0 %
Lymphocytes Absolute: 1.7 10*3/uL (ref 0.7–3.1)
Lymphs: 36 %
MCH: 26.6 pg (ref 26.6–33.0)
MCHC: 31.5 g/dL (ref 31.5–35.7)
MCV: 85 fL (ref 79–97)
Monocytes Absolute: 0.3 10*3/uL (ref 0.1–0.9)
Monocytes: 7 %
Neutrophils Absolute: 2.4 10*3/uL (ref 1.4–7.0)
Neutrophils: 52 %
Platelets: 236 10*3/uL (ref 150–450)
RBC: 4.4 x10E6/uL (ref 3.77–5.28)
RDW: 16.1 % — ABNORMAL HIGH (ref 11.7–15.4)
WBC: 4.6 10*3/uL (ref 3.4–10.8)

## 2023-06-08 LAB — COMPREHENSIVE METABOLIC PANEL
ALT: 11 [IU]/L (ref 0–32)
AST: 22 [IU]/L (ref 0–40)
Albumin: 4 g/dL (ref 3.9–4.9)
Alkaline Phosphatase: 61 [IU]/L (ref 44–121)
BUN/Creatinine Ratio: 13 (ref 9–23)
BUN: 11 mg/dL (ref 6–24)
Bilirubin Total: 0.2 mg/dL (ref 0.0–1.2)
CO2: 25 mmol/L (ref 20–29)
Calcium: 9.2 mg/dL (ref 8.7–10.2)
Chloride: 105 mmol/L (ref 96–106)
Creatinine, Ser: 0.83 mg/dL (ref 0.57–1.00)
Globulin, Total: 2.7 g/dL (ref 1.5–4.5)
Glucose: 86 mg/dL (ref 70–99)
Potassium: 4.2 mmol/L (ref 3.5–5.2)
Sodium: 141 mmol/L (ref 134–144)
Total Protein: 6.7 g/dL (ref 6.0–8.5)
eGFR: 91 mL/min/{1.73_m2} (ref >59)

## 2023-06-08 LAB — LIPID PANEL W/O CHOL/HDL RATIO
Cholesterol, Total: 164 mg/dL (ref 100–199)
HDL: 52 mg/dL (ref >39)
LDL Chol Calc (NIH): 101 mg/dL — ABNORMAL HIGH (ref 0–99)
Triglycerides: 57 mg/dL (ref 0–149)
VLDL Cholesterol Cal: 11 mg/dL (ref 5–40)

## 2023-06-08 LAB — TSH: TSH: 1.21 u[IU]/mL (ref 0.450–4.500)

## 2023-07-31 ENCOUNTER — Ambulatory Visit: Payer: Medicaid Other

## 2023-07-31 ENCOUNTER — Other Ambulatory Visit: Payer: Self-pay

## 2023-07-31 VITALS — BP 133/89 | HR 51 | Wt 205.8 lb

## 2023-07-31 DIAGNOSIS — Z3042 Encounter for surveillance of injectable contraceptive: Secondary | ICD-10-CM

## 2023-07-31 MED ORDER — MEDROXYPROGESTERONE ACETATE 150 MG/ML IM SUSY
150.0000 mg | PREFILLED_SYRINGE | Freq: Once | INTRAMUSCULAR | Status: AC
Start: 2023-07-31 — End: 2023-07-31
  Administered 2023-07-31: 150 mg via INTRAMUSCULAR

## 2023-07-31 NOTE — Progress Notes (Signed)
 Jody Bullock here for Depo-Provera Injection. Injection administered without complication. Patient will return in 3 months for next injection between 10/16/23 and 10/30/23. Next annual visit due December 2025.   Marjo Bicker, RN 07/31/2023  10:04 AM

## 2023-10-17 ENCOUNTER — Other Ambulatory Visit: Payer: Self-pay

## 2023-10-17 ENCOUNTER — Ambulatory Visit

## 2023-10-17 VITALS — BP 135/81 | HR 56 | Ht 62.0 in | Wt 204.5 lb

## 2023-10-17 DIAGNOSIS — Z3042 Encounter for surveillance of injectable contraceptive: Secondary | ICD-10-CM | POA: Diagnosis not present

## 2023-10-17 MED ORDER — MEDROXYPROGESTERONE ACETATE 150 MG/ML IM SUSY
150.0000 mg | PREFILLED_SYRINGE | Freq: Once | INTRAMUSCULAR | Status: AC
Start: 2023-10-17 — End: 2023-10-17
  Administered 2023-10-17: 150 mg via INTRAMUSCULAR

## 2023-10-17 NOTE — Progress Notes (Signed)
 Jody Bullock here for Depo-Provera  Injection. Injection administered without complication. Patient will return in 3 months for next injection between August 19 and September 2. Next annual visit due 05/2024.   Persephanie Laatsch, RN 10/17/2023  9:12 AM

## 2024-01-02 ENCOUNTER — Ambulatory Visit

## 2024-01-02 ENCOUNTER — Other Ambulatory Visit: Payer: Self-pay

## 2024-01-02 VITALS — BP 143/88 | HR 51 | Ht 61.5 in | Wt 200.0 lb

## 2024-01-02 DIAGNOSIS — Z3042 Encounter for surveillance of injectable contraceptive: Secondary | ICD-10-CM | POA: Diagnosis not present

## 2024-01-02 MED ORDER — MEDROXYPROGESTERONE ACETATE 150 MG/ML IM SUSY
150.0000 mg | PREFILLED_SYRINGE | Freq: Once | INTRAMUSCULAR | Status: AC
Start: 2024-01-02 — End: 2024-01-02
  Administered 2024-01-02: 150 mg via INTRAMUSCULAR

## 2024-01-02 NOTE — Progress Notes (Signed)
 Jody Bullock here for Depo-Provera  Injection. Last dose was on 10/17/23. Injection administered without complication. Patient will return in 3 months for next injection between November 5th and November 19th. Next annual visit due December 2025. Patient to schedule next injection at checkout.     Rosaline Pendleton, RN 01/02/2024  9:19 AM

## 2024-03-19 ENCOUNTER — Ambulatory Visit: Admitting: *Deleted

## 2024-03-19 ENCOUNTER — Other Ambulatory Visit: Payer: Self-pay

## 2024-03-19 ENCOUNTER — Ambulatory Visit

## 2024-03-19 VITALS — BP 138/84 | HR 58 | Ht 61.5 in | Wt 203.6 lb

## 2024-03-19 DIAGNOSIS — Z3042 Encounter for surveillance of injectable contraceptive: Secondary | ICD-10-CM

## 2024-03-19 MED ORDER — MEDROXYPROGESTERONE ACETATE 150 MG/ML IM SUSP
150.0000 mg | Freq: Once | INTRAMUSCULAR | Status: AC
Start: 2024-03-19 — End: 2024-03-19
  Administered 2024-03-19: 150 mg via INTRAMUSCULAR

## 2024-03-19 NOTE — Progress Notes (Signed)
 Depo provera  150 mg IM administered as scheduled. Pt tolerated well. Next injection due 06/04/24 - 06/18/24. Pt was advised that next Annual Gyn exam w/Pap due after 05/06/24. She voiced understanding and will schedule appt.

## 2024-05-12 ENCOUNTER — Other Ambulatory Visit (HOSPITAL_COMMUNITY)
Admission: RE | Admit: 2024-05-12 | Discharge: 2024-05-12 | Disposition: A | Discharge status: Home / Self Care | Source: Ambulatory Visit | Attending: Obstetrics and Gynecology | Admitting: Obstetrics and Gynecology

## 2024-05-12 ENCOUNTER — Encounter: Payer: Self-pay | Admitting: Obstetrics and Gynecology

## 2024-05-12 ENCOUNTER — Ambulatory Visit: Admitting: Obstetrics and Gynecology

## 2024-05-12 ENCOUNTER — Other Ambulatory Visit: Payer: Self-pay | Admitting: Obstetrics and Gynecology

## 2024-05-12 VITALS — BP 141/97 | HR 52 | Ht 61.5 in | Wt 202.4 lb

## 2024-05-12 DIAGNOSIS — Z01419 Encounter for gynecological examination (general) (routine) without abnormal findings: Secondary | ICD-10-CM

## 2024-05-12 DIAGNOSIS — Z113 Encounter for screening for infections with a predominantly sexual mode of transmission: Secondary | ICD-10-CM | POA: Diagnosis present

## 2024-05-12 DIAGNOSIS — Z23 Encounter for immunization: Secondary | ICD-10-CM | POA: Diagnosis not present

## 2024-05-12 DIAGNOSIS — Z1231 Encounter for screening mammogram for malignant neoplasm of breast: Secondary | ICD-10-CM

## 2024-05-12 DIAGNOSIS — Z124 Encounter for screening for malignant neoplasm of cervix: Secondary | ICD-10-CM | POA: Diagnosis present

## 2024-05-12 NOTE — Progress Notes (Signed)
 "   GYNECOLOGY ANNUAL PREVENTATIVE CARE ENCOUNTER NOTE  History:     Jody Bullock is a 43 y.o. [phone] female here for a routine annual gynecologic exam.  Current complaints: none.   Denies abnormal vaginal bleeding, discharge, pelvic pain, problems with intercourse or other gynecologic concerns.    Gynecologic History No LMP recorded. Patient has had an injection. Contraception: Depo-Provera  injections Last Pap: 8/22. Results were: normal with negative HPV Last mammogram: 2025. Results were: normal  Obstetric History OB History  Gravida Para Term Preterm AB Living  4 2 1 1 1 2   SAB IAB Ectopic Multiple Live Births  1 0 0 0 2    # Outcome Date GA Lbr Len/2nd Weight Sex Type Anes PTL Lv  4 Gravida           3 Preterm 09/09/15 [redacted]w[redacted]d 08:20 / 00:22 6 lb 10.7 oz (3.025 kg) M Vag-Spont EPI  LIV     Birth Comments: preterm  2 SAB 2014 [redacted]w[redacted]d    SAB     1 Term 05/02/09 [redacted]w[redacted]d  7 lb 6 oz (3.345 kg) M Vag-Spont EPI N LIV    Past Medical History:  Diagnosis Date   At high risk for complications of intrauterine pregnancy (IUP) 09/05/2012   Eczema    Encounter for supervision of other normal pregnancy in first trimester 04/01/2015   Environmental allergies    Hemoperitoneum due to rupture of right tubal ectopic pregnancy 12/09/2022   Miscarriage 09/03/2012   NVD (normal vaginal delivery) 09/09/2015   Preeclampsia in postpartum period    Pre eclampsia after birth of son in 2017   Preterm premature rupture of membranes (PPROM) with unknown onset of labor 09/08/2015   Right tubal pregnancy without intrauterine pregnancy 12/09/2022    Past Surgical History:  Procedure Laterality Date   LAPAROSCOPIC UNILATERAL SALPINGECTOMY Right 12/09/2022   Procedure: LAPAROSCOPIC RIGHT SALPINGECTOMY WITH REMOVAL OF ECTOPIC PREGNANCY AND EVACUATION OF HEMOPERITONEUM;  Surgeon: Ozan, Jennifer, DO;  Location: [facility] OR;  Service: Gynecology;  Laterality: Right;    Medications Ordered Prior to  Encounter[1]  Allergies[2]  Social History:  reports that she has never smoked. She has never been exposed to tobacco smoke. She has never used smokeless tobacco. She reports that she does not currently use alcohol. She reports current drug use. Drug: Marijuana.  Family History  Problem Relation Age of Onset   Alcohol abuse Mother        Denies alcohol abuse, but history of drinking and driving.   Cancer Mother        Dysplastic endometrial cells--total hysterectomy.   Gallbladder disease Mother    Cancer Father        prostate    Anxiety disorder Father    Heart disease Father    Hyperlipidemia Father    Heart disease Brother        Died of AMI   Breast cancer Maternal Grandmother 8 - 75    The following portions of the patient's history were reviewed and updated as appropriate: allergies, current medications, past family history, past medical history, past social history, past surgical history and problem list.  Review of Systems Pertinent items noted in HPI and remainder of comprehensive ROS otherwise negative.  Physical Exam:  BP (!) 141/97   Pulse (!) 52   Ht 5' 1.5 (1.562 m)   Wt 202 lb 6.4 oz (91.8 kg)   BMI 37.62 kg/m  CONSTITUTIONAL: Well-developed, well-nourished female in no acute distress.  HENT:  Normocephalic, atraumatic, External right and left ear normal. Oropharynx is clear and moist EYES: Conjunctivae and EOM are normal.  NECK: Normal range of motion, supple, no masses.  Normal thyroid.  SKIN: Skin is warm and dry. No rash noted. Not diaphoretic. No erythema. No pallor. MUSCULOSKELETAL: Normal range of motion. No tenderness.  No cyanosis, clubbing, or edema.  2+ distal pulses. NEUROLOGIC: Alert and oriented to person, place, and time. Normal reflexes, muscle tone coordination.  PSYCHIATRIC: Normal mood and affect. Normal behavior. Normal judgment and thought content. CARDIOVASCULAR: Normal heart rate noted, regular rhythm RESPIRATORY: Clear to  auscultation bilaterally. Effort and breath sounds normal, no problems with respiration noted. BREASTS: deferred ABDOMEN: Soft, no distention noted.  No tenderness, rebound or guarding.  PELVIC: Normal appearing external genitalia and urethral meatus; normal appearing vaginal mucosa and cervix.  No abnormal discharge noted.  Pap smear obtained.  Normal uterine size, no other palpable masses, no uterine or adnexal tenderness.  Performed in the presence of a chaperone.   Assessment and Plan:    1. Cervical cancer screening (Primary)  - Cytology - PAP( [place])  2. Screening for STD (sexually transmitted disease) Pt request - Cytology - PAP( [place]) - HIV Antibody (routine testing w rflx) - RPR W/RFLX TO RPR TITER, TREPONEMAL AB, SCREEN AND DIAGNOSIS - Hepatitis B Surface AntiGEN - Hepatitis C Antibody  3. Women's annual routine gynecological examination Normal annual exam - Flu vaccine trivalent PF, 6mos and older(Flulaval,Afluria,Fluarix,Fluzone)  Will follow up results of pap smear and manage accordingly. Mammogram scheduled Routine preventative health maintenance measures emphasized. Please refer to After Visit Summary for other counseling recommendations.      Jerilynn Buddle, MD, FACOG Obstetrician & Gynecologist, [facility], [facility]     [1]  Current Outpatient Medications on File Prior to Visit  Medication Sig Dispense Refill   medroxyPROGESTERone  (DEPO-PROVERA ) 150 MG/ML injection Inject 150 mg into the muscle every 3 (three) months.     No current facility-administered medications on file prior to visit.  [2] No Known Allergies  "

## 2024-05-13 ENCOUNTER — Ambulatory Visit: Payer: Self-pay | Admitting: Obstetrics and Gynecology

## 2024-05-13 LAB — HEPATITIS C ANTIBODY: Hep C Virus Ab: NONREACTIVE

## 2024-05-13 LAB — HIV ANTIBODY (ROUTINE TESTING W REFLEX): HIV Screen 4th Generation wRfx: NONREACTIVE

## 2024-05-13 LAB — SYPHILIS: RPR W/REFLEX TO RPR TITER AND TREPONEMAL ANTIBODIES, TRADITIONAL SCREENING AND DIAGNOSIS ALGORITHM: RPR Ser Ql: NONREACTIVE

## 2024-05-13 LAB — HEPATITIS B SURFACE ANTIGEN: Hepatitis B Surface Ag: NEGATIVE

## 2024-05-20 ENCOUNTER — Ambulatory Visit
Admission: RE | Admit: 2024-05-20 | Discharge: 2024-05-20 | Disposition: A | Source: Ambulatory Visit | Attending: Obstetrics and Gynecology

## 2024-05-20 DIAGNOSIS — Z1231 Encounter for screening mammogram for malignant neoplasm of breast: Secondary | ICD-10-CM

## 2024-05-20 LAB — CYTOLOGY - PAP
Chlamydia: NEGATIVE
Comment: NEGATIVE
Comment: NEGATIVE
Comment: NEGATIVE
Comment: NORMAL
Diagnosis: NEGATIVE
Diagnosis: REACTIVE
High risk HPV: NEGATIVE
Neisseria Gonorrhea: NEGATIVE
Trichomonas: NEGATIVE

## 2024-05-23 ENCOUNTER — Ambulatory Visit: Payer: Self-pay | Admitting: Obstetrics and Gynecology

## 2024-05-27 ENCOUNTER — Telehealth: Payer: Self-pay | Admitting: Internal Medicine

## 2024-05-27 ENCOUNTER — Other Ambulatory Visit (INDEPENDENT_AMBULATORY_CARE_PROVIDER_SITE_OTHER): Payer: Medicaid Other

## 2024-05-27 VITALS — BP 144/80 | HR 50

## 2024-05-27 DIAGNOSIS — I10 Essential (primary) hypertension: Secondary | ICD-10-CM

## 2024-05-27 DIAGNOSIS — E66811 Obesity, class 1: Secondary | ICD-10-CM

## 2024-05-27 DIAGNOSIS — Z6834 Body mass index (BMI) 34.0-34.9, adult: Secondary | ICD-10-CM

## 2024-05-27 DIAGNOSIS — D649 Anemia, unspecified: Secondary | ICD-10-CM

## 2024-05-27 DIAGNOSIS — E6609 Other obesity due to excess calories: Secondary | ICD-10-CM | POA: Diagnosis not present

## 2024-05-27 DIAGNOSIS — Z0130 Encounter for examination of blood pressure without abnormal findings: Secondary | ICD-10-CM

## 2024-05-27 NOTE — Telephone Encounter (Signed)
 Patient had to reschedule his CPE appointment due to has another appointment same time.  Patient would like a sooner appointment than April.

## 2024-05-27 NOTE — Progress Notes (Unsigned)
 Bp today is 144/80 and Pulse is 50  She does not get any medication for bp   Per Dr adella if she welling to get medication ?

## 2024-05-28 LAB — COMPREHENSIVE METABOLIC PANEL WITH GFR
ALT: 10 IU/L (ref 0–32)
AST: 17 IU/L (ref 0–40)
Albumin: 4.2 g/dL (ref 3.9–4.9)
Alkaline Phosphatase: 67 IU/L (ref 41–116)
BUN/Creatinine Ratio: 13 (ref 9–23)
BUN: 12 mg/dL (ref 6–24)
Bilirubin Total: 0.4 mg/dL (ref 0.0–1.2)
CO2: 21 mmol/L (ref 20–29)
Calcium: 9.4 mg/dL (ref 8.7–10.2)
Chloride: 108 mmol/L — ABNORMAL HIGH (ref 96–106)
Creatinine, Ser: 0.93 mg/dL (ref 0.57–1.00)
Globulin, Total: 2.4 g/dL (ref 1.5–4.5)
Glucose: 83 mg/dL (ref 70–99)
Potassium: 4.8 mmol/L (ref 3.5–5.2)
Sodium: 142 mmol/L (ref 134–144)
Total Protein: 6.6 g/dL (ref 6.0–8.5)
eGFR: 79 mL/min/1.73

## 2024-05-28 LAB — CBC WITH DIFFERENTIAL/PLATELET
Basophils Absolute: 0 x10E3/uL (ref 0.0–0.2)
Basos: 1 %
EOS (ABSOLUTE): 0.2 x10E3/uL (ref 0.0–0.4)
Eos: 3 %
Hematocrit: 42 % (ref 34.0–46.6)
Hemoglobin: 13.8 g/dL (ref 11.1–15.9)
Immature Grans (Abs): 0 x10E3/uL (ref 0.0–0.1)
Immature Granulocytes: 0 %
Lymphocytes Absolute: 2 x10E3/uL (ref 0.7–3.1)
Lymphs: 38 %
MCH: 30.6 pg (ref 26.6–33.0)
MCHC: 32.9 g/dL (ref 31.5–35.7)
MCV: 93 fL (ref 79–97)
Monocytes Absolute: 0.4 x10E3/uL (ref 0.1–0.9)
Monocytes: 7 %
Neutrophils Absolute: 2.7 x10E3/uL (ref 1.4–7.0)
Neutrophils: 50 %
Platelets: 216 x10E3/uL (ref 150–450)
RBC: 4.51 x10E6/uL (ref 3.77–5.28)
RDW: 12.9 % (ref 11.7–15.4)
WBC: 5.3 x10E3/uL (ref 3.4–10.8)

## 2024-05-28 LAB — LIPID PANEL W/O CHOL/HDL RATIO
Cholesterol, Total: 165 mg/dL (ref 100–199)
HDL: 53 mg/dL
LDL Chol Calc (NIH): 100 mg/dL — ABNORMAL HIGH (ref 0–99)
Triglycerides: 61 mg/dL (ref 0–149)
VLDL Cholesterol Cal: 12 mg/dL (ref 5–40)

## 2024-05-28 LAB — TSH: TSH: 1.39 u[IU]/mL (ref 0.450–4.500)

## 2024-05-28 MED ORDER — AMLODIPINE BESYLATE 5 MG PO TABS: 5.0000 mg | ORAL_TABLET | Freq: Every day | ORAL | 11 refills | Status: DC

## 2024-05-28 NOTE — Progress Notes (Signed)
 Would like her to start amlodipine  5 mg daily with repeat bp and pulse in 2 weeks.

## 2024-05-29 ENCOUNTER — Encounter: Payer: Medicaid Other | Admitting: Internal Medicine

## 2024-05-29 NOTE — Progress Notes (Signed)
 Called to patient , The patient is welling to get bp medication and she is scheduled for pb check in two weeks

## 2024-06-04 ENCOUNTER — Other Ambulatory Visit: Payer: Self-pay

## 2024-06-04 ENCOUNTER — Ambulatory Visit: Admitting: General Practice

## 2024-06-04 VITALS — BP 138/78 | HR 65 | Ht 61.5 in | Wt 209.0 lb

## 2024-06-04 DIAGNOSIS — Z3042 Encounter for surveillance of injectable contraceptive: Secondary | ICD-10-CM

## 2024-06-04 MED ORDER — MEDROXYPROGESTERONE ACETATE 150 MG/ML IM SUSY
150.0000 mg | PREFILLED_SYRINGE | Freq: Once | INTRAMUSCULAR | Status: AC
  Administered 2024-06-04: 150 mg via INTRAMUSCULAR

## 2024-06-04 NOTE — Progress Notes (Signed)
 Jody Bullock [place] here for Depo-Provera  Injection. Injection administered without complication. Patient will return in 3 months for next injection between 4/8 and 4/22. Next annual visit due December 2026.   Elenor Mole, RN 06/04/2024  10:11 AM

## 2024-06-12 ENCOUNTER — Other Ambulatory Visit (INDEPENDENT_AMBULATORY_CARE_PROVIDER_SITE_OTHER)

## 2024-06-12 VITALS — BP 150/90 | HR 80

## 2024-06-12 DIAGNOSIS — I10 Essential (primary) hypertension: Secondary | ICD-10-CM | POA: Diagnosis not present

## 2024-06-12 DIAGNOSIS — Z0130 Encounter for examination of blood pressure without abnormal findings: Secondary | ICD-10-CM | POA: Diagnosis not present

## 2024-06-12 NOTE — Progress Notes (Signed)
 150/90 pulse 80  Amlodipine  5 mg. She took them today.

## 2024-06-17 ENCOUNTER — Telehealth: Payer: Self-pay | Admitting: Internal Medicine

## 2024-06-17 MED ORDER — AMLODIPINE BESYLATE 10 MG PO TABS: ORAL_TABLET | ORAL | 11 refills | Status: AC

## 2024-06-17 NOTE — Telephone Encounter (Signed)
 Patient called and states she was seen for a bp check last Thursday 06/12/24 , patient states her bp was high and she would like to know if Doctor has any changes for her to make.

## 2024-06-17 NOTE — Progress Notes (Signed)
 Increase amlodipine  to 10 mg daily

## 2024-06-18 NOTE — Telephone Encounter (Signed)
 Patient has been notified to take two 5mg  Amlodipine  daily at same time until almost out, she will get a new prescription of 10mg  tablet, notified patient once receives new prescription she is to take 1 tablet daily.   Patient asked if she needs to see nurse for bp check .

## 2024-08-20 ENCOUNTER — Ambulatory Visit: Payer: Self-pay

## 2024-09-03 ENCOUNTER — Encounter: Admitting: Internal Medicine
